# Patient Record
Sex: Male | Born: 1975 | Hispanic: Yes | Marital: Married | Smoking: Current every day smoker
Health system: Southern US, Community
[De-identification: ages and names within clinical notes are randomized; demographics above are authoritative.]

## PROBLEM LIST (undated history)

## (undated) DIAGNOSIS — R569 Unspecified convulsions: Secondary | ICD-10-CM

## (undated) DIAGNOSIS — F1020 Alcohol dependence, uncomplicated: Secondary | ICD-10-CM

## (undated) DIAGNOSIS — I8500 Esophageal varices without bleeding: Secondary | ICD-10-CM

---

## 2013-10-11 ENCOUNTER — Emergency Department (HOSPITAL_COMMUNITY): Payer: Self-pay

## 2013-10-11 ENCOUNTER — Encounter (HOSPITAL_COMMUNITY): Payer: Self-pay | Admitting: Emergency Medicine

## 2013-10-11 ENCOUNTER — Inpatient Hospital Stay (HOSPITAL_COMMUNITY)
Admission: EM | Admit: 2013-10-11 | Discharge: 2013-10-14 | DRG: 441 | Disposition: A | Discharge status: Home / Self Care | Payer: Self-pay | Attending: Internal Medicine | Admitting: Internal Medicine

## 2013-10-11 DIAGNOSIS — K7010 Alcoholic hepatitis without ascites: Secondary | ICD-10-CM | POA: Diagnosis present

## 2013-10-11 DIAGNOSIS — F1010 Alcohol abuse, uncomplicated: Secondary | ICD-10-CM | POA: Diagnosis present

## 2013-10-11 DIAGNOSIS — R578 Other shock: Secondary | ICD-10-CM | POA: Diagnosis present

## 2013-10-11 DIAGNOSIS — I8511 Secondary esophageal varices with bleeding: Secondary | ICD-10-CM | POA: Diagnosis present

## 2013-10-11 DIAGNOSIS — F17200 Nicotine dependence, unspecified, uncomplicated: Secondary | ICD-10-CM | POA: Diagnosis present

## 2013-10-11 DIAGNOSIS — K920 Hematemesis: Secondary | ICD-10-CM

## 2013-10-11 DIAGNOSIS — K7460 Unspecified cirrhosis of liver: Secondary | ICD-10-CM | POA: Diagnosis present

## 2013-10-11 DIAGNOSIS — K766 Portal hypertension: Principal | ICD-10-CM | POA: Diagnosis present

## 2013-10-11 DIAGNOSIS — K922 Gastrointestinal hemorrhage, unspecified: Secondary | ICD-10-CM

## 2013-10-11 DIAGNOSIS — D62 Acute posthemorrhagic anemia: Secondary | ICD-10-CM | POA: Diagnosis present

## 2013-10-11 HISTORY — DX: Unspecified convulsions: R56.9

## 2013-10-11 HISTORY — DX: Alcohol dependence, uncomplicated: F10.20

## 2013-10-11 LAB — CBC WITH DIFFERENTIAL/PLATELET
Basophils Absolute: 0.1 10*3/uL (ref 0.0–0.1)
Basophils Relative: 0 % (ref 0–1)
Eosinophils Relative: 0 % (ref 0–5)
Lymphocytes Relative: 5 % — ABNORMAL LOW (ref 12–46)
Lymphs Abs: 1.3 10*3/uL (ref 0.7–4.0)
MCV: 99.4 fL (ref 78.0–100.0)
Neutro Abs: 25.5 10*3/uL — ABNORMAL HIGH (ref 1.7–7.7)
Neutrophils Relative %: 89 % — ABNORMAL HIGH (ref 43–77)
Platelets: 152 10*3/uL (ref 150–400)
RBC: 3.51 MIL/uL — ABNORMAL LOW (ref 4.22–5.81)
RDW: 15 % (ref 11.5–15.5)
WBC: 28.6 10*3/uL — ABNORMAL HIGH (ref 4.0–10.5)

## 2013-10-11 LAB — URINALYSIS, ROUTINE W REFLEX MICROSCOPIC
Glucose, UA: 100 mg/dL — AB
Hgb urine dipstick: NEGATIVE
Protein, ur: 30 mg/dL — AB

## 2013-10-11 LAB — COMPREHENSIVE METABOLIC PANEL
ALT: 22 U/L (ref 0–53)
AST: 86 U/L — ABNORMAL HIGH (ref 0–37)
CO2: 21 mEq/L (ref 19–32)
Chloride: 101 mEq/L (ref 96–112)
GFR calc non Af Amer: >90 mL/min (ref >90)
Potassium: 4.3 mEq/L (ref 3.5–5.1)
Sodium: 137 mEq/L (ref 135–145)
Total Bilirubin: 1.8 mg/dL — ABNORMAL HIGH (ref 0.3–1.2)
Total Protein: 7.8 g/dL (ref 6.0–8.3)

## 2013-10-11 MED ORDER — PROMETHAZINE HCL 25 MG/ML IJ SOLN
12.5000 mg | Freq: Once | INTRAMUSCULAR | Status: AC
  Administered 2013-10-12: 12.5 mg via INTRAVENOUS
  Filled 2013-10-11: qty 1

## 2013-10-11 MED ORDER — SODIUM CHLORIDE 0.9 % IV SOLN
8.0000 mg/h | INTRAVENOUS | Status: DC
  Administered 2013-10-12 – 2013-10-14 (×6): 8 mg/h via INTRAVENOUS
  Filled 2013-10-11 (×13): qty 80

## 2013-10-11 MED ORDER — PANTOPRAZOLE SODIUM 40 MG IV SOLR
40.0000 mg | Freq: Once | INTRAVENOUS | Status: AC
  Administered 2013-10-12: 40 mg via INTRAVENOUS
  Filled 2013-10-11: qty 40

## 2013-10-11 MED ORDER — SODIUM CHLORIDE 0.9 % IV BOLUS (SEPSIS)
1000.0000 mL | Freq: Once | INTRAVENOUS | Status: AC
  Administered 2013-10-12: 1000 mL via INTRAVENOUS

## 2013-10-11 MED ORDER — SODIUM CHLORIDE 0.9 % IV BOLUS (SEPSIS)
1000.0000 mL | Freq: Once | INTRAVENOUS | Status: AC
  Administered 2013-10-11: 1000 mL via INTRAVENOUS

## 2013-10-11 MED ORDER — ONDANSETRON HCL 4 MG/2ML IJ SOLN
4.0000 mg | Freq: Once | INTRAMUSCULAR | Status: AC
  Administered 2013-10-11: 4 mg via INTRAVENOUS
  Filled 2013-10-11: qty 2

## 2013-10-11 MED ORDER — LORAZEPAM 2 MG/ML IJ SOLN: 1.0000 mg | Freq: Once | INTRAMUSCULAR | Status: DC

## 2013-10-11 MED ORDER — PANTOPRAZOLE SODIUM 40 MG IV SOLR
40.0000 mg | Freq: Once | INTRAVENOUS | Status: AC
  Administered 2013-10-11: 40 mg via INTRAVENOUS
  Filled 2013-10-11: qty 40

## 2013-10-11 NOTE — ED Notes (Signed)
Fiance' states that he has been feeling really full, thirsty, and has been vomiting blood. Started today around 1130-1200. Patient states that he has vomited around 5 times.

## 2013-10-11 NOTE — ED Notes (Signed)
Pt states first time he vomited dark coffee blood, vomiting x5 today, no bright red blood.

## 2013-10-11 NOTE — ED Provider Notes (Signed)
CSN: [id number]     Arrival date & time 10/11/13  2102 History   First MD Initiated Contact with Patient 10/11/13 2114     Chief Complaint  Patient presents with  . Hematemesis  . Weakness   (Consider location/radiation/quality/duration/timing/severity/associated sxs/prior Treatment) HPI Comments: Adam Sanchez is a 37 y.o. Male presenting with hematemesis starting around noon today.  He reports 4-5 episodes of blood emesis, the first episode describing a small quantity of blood,  With each successive episode being frankly bloody.  He has had increasing early satiety and decreased appetite over the past several weeks and has had increasing abdominal distention and bloating. He reports having a normal bowel movement today which was nonbloody and normal color.  He has mild epigastric discomfort.  He does have a history of alcoholism, but decided to cut back 2 weeks ago, his last etoh intake was yesterday.  He has found no alleviators for his symptoms. He denies history prior abdominal complaints, acid reflux and has no surgical history.     The history is provided by the patient.    Past Medical History  Diagnosis Date  . Seizures   . Alcoholism    History reviewed. No pertinent past surgical history. History reviewed. No pertinent family history. History  Substance Use Topics  . Smoking status: Current Every Day Smoker  . Smokeless tobacco: Not on file  . Alcohol Use: Yes    Review of Systems  Constitutional: Negative for fever and diaphoresis.  HENT: Negative for congestion and sore throat.   Eyes: Negative.   Respiratory: Negative for chest tightness and shortness of breath.   Cardiovascular: Negative for chest pain.  Gastrointestinal: Positive for nausea, vomiting and abdominal distention. Negative for abdominal pain, blood in stool and rectal pain.  Genitourinary: Negative.   Musculoskeletal: Negative for arthralgias, joint swelling and neck pain.  Skin: Negative.   Negative for rash and wound.  Neurological: Positive for weakness. Negative for dizziness, light-headedness, numbness and headaches.  Psychiatric/Behavioral: Negative.     Allergies  Review of patient's allergies indicates no known allergies.  Home Medications   Current Outpatient Rx  Name  Route  Sig  Dispense  Refill  . Aspirin-Acetaminophen-Caffeine (GOODY HEADACHE PO)   Oral   Take 1 packet by mouth daily as needed (for pain).         . magnesium citrate SOLN   Oral   Take 0.5-1 Bottles by mouth once.          BP 94/46  Pulse 130  Temp(Src) 99 F (37.2 C) (Oral)  Resp 24  Ht 5\' 7"  (1.702 m)  Wt 180 lb (81.647 kg)  BMI 28.19 kg/m2  SpO2 97% Physical Exam  Nursing note and vitals reviewed. Constitutional: He appears well-developed and well-nourished.  HENT:  Head: Normocephalic and atraumatic.  Eyes: Conjunctivae are normal. No scleral icterus.  Neck: Normal range of motion.  Cardiovascular: Regular rhythm, normal heart sounds and intact distal pulses.  Tachycardia present.   Pulmonary/Chest: Effort normal and breath sounds normal. He has no wheezes.  Abdominal: Soft. Bowel sounds are normal. He exhibits distension. He exhibits no fluid wave and no ascites. There is no hepatosplenomegaly. There is tenderness in the epigastric area. There is no rebound and no guarding.  Mild epigastric discomfort  Musculoskeletal: Normal range of motion.  Neurological: He is alert.  Skin: Skin is warm and dry.  Psychiatric: He has a normal mood and affect.    ED Course  Procedures (including critical  care time) Labs Review Labs Reviewed  CBC WITH DIFFERENTIAL - Abnormal; Notable for the following:    WBC 28.6 (*)    RBC 3.51 (*)    Hemoglobin 11.9 (*)    HCT 34.9 (*)    Neutrophils Relative % 89 (*)    Neutro Abs 25.5 (*)    Lymphocytes Relative 5 (*)    Monocytes Absolute 1.7 (*)    All other components within normal limits  COMPREHENSIVE METABOLIC PANEL - Abnormal;  Notable for the following:    Glucose, Bld 179 (*)    Creatinine, Ser 0.48 (*)    Albumin 2.5 (*)    AST 86 (*)    Alkaline Phosphatase 305 (*)    Total Bilirubin 1.8 (*)    All other components within normal limits  URINALYSIS, ROUTINE W REFLEX MICROSCOPIC - Abnormal; Notable for the following:    Color, Urine AMBER (*)    Glucose, UA 100 (*)    Bilirubin Urine SMALL (*)    Ketones, ur TRACE (*)    Protein, ur 30 (*)    All other components within normal limits  LIPASE, BLOOD  URINE MICROSCOPIC-ADD ON  TYPE AND SCREEN   Imaging Review Dg Abd Acute W/chest  10/11/2013   CLINICAL DATA:  Hematemesis.  EXAM: ACUTE ABDOMEN SERIES (ABDOMEN 2 VIEW & CHEST 1 VIEW)  COMPARISON:  None.  FINDINGS: Normal cardiac silhouette. Lungs are clear. No free air beneath the hemidiaphragms.  Is a paucity of gas within the abdomen. Small a gas in the stomach. Rounded density along the left abdomen measuring 3.8 cm likely represents stool. Normal renal shadows. No pathologic calcifications.  IMPRESSION: 1. No acute cardiopulmonary process.  2. No evidence of bowel obstruction or  intraperitoneal free air.  3. Paucity of gas in the abdomen can be seen with persistent vomiting.   Electronically Signed   By: Genevive Bi M.D.   On: 10/11/2013 22:37    EKG Interpretation   None       MDM   1. Upper GI bleed   2. Alcohol abuse    Pt was also seen by Dr Patria Mane, who spoke with Dr Karilyn Cota who will plan to scope patient in am.  Asked for medical admission. Spoke with Dr. Alvester Morin who will see pt in ed.  Additional IV fluids ordered.  Melena on rectal exam by Dr. Patria Mane.  Hemoglobin stable.     Burgess Amor, PA-C 10/12/13 0020

## 2013-10-12 ENCOUNTER — Encounter (HOSPITAL_COMMUNITY): Payer: Self-pay

## 2013-10-12 ENCOUNTER — Encounter (HOSPITAL_COMMUNITY)
Admission: EM | Disposition: A | Discharge status: Home / Self Care | Payer: Self-pay | Source: Home / Self Care | Attending: Pulmonary Disease

## 2013-10-12 DIAGNOSIS — R578 Other shock: Secondary | ICD-10-CM | POA: Diagnosis present

## 2013-10-12 DIAGNOSIS — K920 Hematemesis: Secondary | ICD-10-CM | POA: Diagnosis present

## 2013-10-12 DIAGNOSIS — F1010 Alcohol abuse, uncomplicated: Secondary | ICD-10-CM

## 2013-10-12 DIAGNOSIS — D62 Acute posthemorrhagic anemia: Secondary | ICD-10-CM

## 2013-10-12 DIAGNOSIS — K922 Gastrointestinal hemorrhage, unspecified: Secondary | ICD-10-CM

## 2013-10-12 HISTORY — PX: ESOPHAGOGASTRODUODENOSCOPY: SHX5428

## 2013-10-12 LAB — RETICULOCYTES
RBC.: 2.51 MIL/uL — ABNORMAL LOW (ref 4.22–5.81)
Retic Count, Absolute: 97.9 10*3/uL (ref 19.0–186.0)
Retic Ct Pct: 3.9 % — ABNORMAL HIGH (ref 0.4–3.1)

## 2013-10-12 LAB — IRON AND TIBC
Iron: 185 ug/dL — ABNORMAL HIGH (ref 42–135)
Saturation Ratios: 74 % — ABNORMAL HIGH (ref 20–55)
UIBC: 64 ug/dL — ABNORMAL LOW (ref 125–400)

## 2013-10-12 LAB — CBC
HCT: 27.1 % — ABNORMAL LOW (ref 39.0–52.0)
Hemoglobin: 8.5 g/dL — ABNORMAL LOW (ref 13.0–17.0)
Hemoglobin: 9.5 g/dL — ABNORMAL LOW (ref 13.0–17.0)
Hemoglobin: 9.4 g/dL — ABNORMAL LOW (ref 13.0–17.0)
MCH: 34 pg (ref 26.0–34.0)
MCHC: 34.1 g/dL (ref 30.0–36.0)
MCHC: 34.7 g/dL (ref 30.0–36.0)
MCV: 93.9 fL (ref 78.0–100.0)
MCV: 93.1 fL (ref 78.0–100.0)
Platelets: 109 10*3/uL — ABNORMAL LOW (ref 150–400)
RBC: 2.94 MIL/uL — ABNORMAL LOW (ref 4.22–5.81)
RBC: 2.91 MIL/uL — ABNORMAL LOW (ref 4.22–5.81)
RDW: 14.9 % (ref 11.5–15.5)
RDW: 17.3 % — ABNORMAL HIGH (ref 11.5–15.5)
WBC: 20.7 10*3/uL — ABNORMAL HIGH (ref 4.0–10.5)
WBC: 17.3 10*3/uL — ABNORMAL HIGH (ref 4.0–10.5)

## 2013-10-12 LAB — HEMOGLOBIN A1C
Hgb A1c MFr Bld: 5.7 % — ABNORMAL HIGH (ref <5.7)
Mean Plasma Glucose: 117 mg/dL — ABNORMAL HIGH (ref <117)

## 2013-10-12 LAB — FERRITIN: Ferritin: 99 ng/mL (ref 22–322)

## 2013-10-12 LAB — FOLATE: Folate: 2.9 ng/mL — ABNORMAL LOW

## 2013-10-12 LAB — GLUCOSE, CAPILLARY: Glucose-Capillary: 93 mg/dL (ref 70–99)

## 2013-10-12 LAB — PROTIME-INR
INR: 1.4 (ref 0.00–1.49)
Prothrombin Time: 16.8 seconds — ABNORMAL HIGH (ref 11.6–15.2)

## 2013-10-12 LAB — MRSA PCR SCREENING: MRSA by PCR: NEGATIVE

## 2013-10-12 LAB — PREPARE RBC (CROSSMATCH)

## 2013-10-12 LAB — AMMONIA: Ammonia: 95 umol/L — ABNORMAL HIGH (ref 11–60)

## 2013-10-12 LAB — ABO/RH: ABO/RH(D): A POS

## 2013-10-12 LAB — OCCULT BLOOD X 1 CARD TO LAB, STOOL: Fecal Occult Bld: POSITIVE — AB

## 2013-10-12 SURGERY — EGD (ESOPHAGOGASTRODUODENOSCOPY)
Anesthesia: Moderate Sedation

## 2013-10-12 MED ORDER — LORAZEPAM 1 MG PO TABS: 1.0000 mg | ORAL_TABLET | Freq: Four times a day (QID) | ORAL | Status: DC | PRN

## 2013-10-12 MED ORDER — FOLIC ACID 1 MG PO TABS
1.0000 mg | ORAL_TABLET | Freq: Every day | ORAL | Status: DC
  Filled 2013-10-12: qty 1

## 2013-10-12 MED ORDER — MIDAZOLAM HCL 5 MG/ML IJ SOLN
INTRAMUSCULAR | Status: AC
  Filled 2013-10-12: qty 2

## 2013-10-12 MED ORDER — MIDAZOLAM HCL 10 MG/2ML IJ SOLN
INTRAMUSCULAR | Status: DC | PRN
  Administered 2013-10-12: 1 mg via INTRAVENOUS
  Administered 2013-10-12 (×2): 2 mg via INTRAVENOUS

## 2013-10-12 MED ORDER — THIAMINE HCL 100 MG/ML IJ SOLN
Freq: Once | INTRAVENOUS | Status: AC
  Administered 2013-10-12: 02:00:00 via INTRAVENOUS
  Filled 2013-10-12: qty 1000

## 2013-10-12 MED ORDER — THIAMINE HCL 100 MG/ML IJ SOLN
INTRAMUSCULAR | Status: AC
  Filled 2013-10-12: qty 2

## 2013-10-12 MED ORDER — PNEUMOCOCCAL VAC POLYVALENT 25 MCG/0.5ML IJ INJ
0.5000 mL | INJECTION | INTRAMUSCULAR | Status: AC
  Administered 2013-10-13: 0.5 mL via INTRAMUSCULAR
  Filled 2013-10-12 (×2): qty 0.5

## 2013-10-12 MED ORDER — LORAZEPAM 2 MG/ML IJ SOLN
0.0000 mg | Freq: Four times a day (QID) | INTRAMUSCULAR | Status: AC
Start: 2013-10-12 — End: 2013-10-14
  Administered 2013-10-13: 1 mg via INTRAVENOUS
  Filled 2013-10-12: qty 1

## 2013-10-12 MED ORDER — INFLUENZA VAC SPLIT QUAD 0.5 ML IM SUSP
0.5000 mL | INTRAMUSCULAR | Status: AC
  Administered 2013-10-13: 0.5 mL via INTRAMUSCULAR
  Filled 2013-10-12 (×3): qty 0.5

## 2013-10-12 MED ORDER — BUTAMBEN-TETRACAINE-BENZOCAINE 2-2-14 % EX AERO
INHALATION_SPRAY | CUTANEOUS | Status: DC | PRN
  Administered 2013-10-12: 2 via TOPICAL

## 2013-10-12 MED ORDER — PANTOPRAZOLE SODIUM 40 MG IV SOLR
INTRAVENOUS | Status: AC
  Filled 2013-10-12: qty 80

## 2013-10-12 MED ORDER — OCTREOTIDE ACETATE 100 MCG/ML IJ SOLN
INTRAMUSCULAR | Status: AC
  Filled 2013-10-12: qty 1

## 2013-10-12 MED ORDER — OCTREOTIDE ACETATE 50 MCG/ML IJ SOLN
25.0000 ug | Freq: Once | INTRAMUSCULAR | Status: AC
  Administered 2013-10-12: 25 ug via INTRAVENOUS
  Filled 2013-10-12: qty 0.5

## 2013-10-12 MED ORDER — LORAZEPAM 2 MG/ML IJ SOLN
1.0000 mg | Freq: Once | INTRAMUSCULAR | Status: AC
  Administered 2013-10-12: 1 mg via INTRAVENOUS
  Filled 2013-10-12: qty 1

## 2013-10-12 MED ORDER — ADULT MULTIVITAMIN W/MINERALS CH
1.0000 | ORAL_TABLET | Freq: Every day | ORAL | Status: DC
  Administered 2013-10-13 – 2013-10-14 (×2): 1 via ORAL
  Filled 2013-10-12 (×3): qty 1

## 2013-10-12 MED ORDER — LORAZEPAM 2 MG/ML IJ SOLN: 0.0000 mg | Freq: Two times a day (BID) | INTRAMUSCULAR | Status: DC

## 2013-10-12 MED ORDER — THIAMINE HCL 100 MG/ML IJ SOLN
100.0000 mg | Freq: Every day | INTRAMUSCULAR | Status: DC
  Filled 2013-10-12 (×3): qty 1

## 2013-10-12 MED ORDER — VITAMIN B-1 100 MG PO TABS
100.0000 mg | ORAL_TABLET | Freq: Every day | ORAL | Status: DC
  Administered 2013-10-13 – 2013-10-14 (×2): 100 mg via ORAL
  Filled 2013-10-12 (×3): qty 1

## 2013-10-12 MED ORDER — SODIUM CHLORIDE 0.9 % IV BOLUS (SEPSIS)
1000.0000 mL | Freq: Once | INTRAVENOUS | Status: AC
  Administered 2013-10-12: 1000 mL via INTRAVENOUS

## 2013-10-12 MED ORDER — FOLIC ACID 5 MG/ML IJ SOLN
INTRAMUSCULAR | Status: AC
  Filled 2013-10-12: qty 0.2

## 2013-10-12 MED ORDER — FOLIC ACID 1 MG PO TABS
1.0000 mg | ORAL_TABLET | Freq: Every day | ORAL | Status: DC
  Administered 2013-10-13 – 2013-10-14 (×2): 1 mg via ORAL
  Filled 2013-10-12 (×3): qty 1

## 2013-10-12 MED ORDER — FOLIC ACID 5 MG/ML IJ SOLN
1.0000 mg | Freq: Every day | INTRAMUSCULAR | Status: DC
  Filled 2013-10-12 (×3): qty 0.2

## 2013-10-12 MED ORDER — OCTREOTIDE ACETATE 500 MCG/ML IJ SOLN
25.0000 ug/h | INTRAMUSCULAR | Status: DC
  Administered 2013-10-12 – 2013-10-13 (×3): 25 ug/h via INTRAVENOUS
  Filled 2013-10-12 (×6): qty 1

## 2013-10-12 MED ORDER — M.V.I. ADULT IV INJ
INJECTION | INTRAVENOUS | Status: AC
  Filled 2013-10-12: qty 10

## 2013-10-12 MED ORDER — FENTANYL CITRATE 0.05 MG/ML IJ SOLN
INTRAMUSCULAR | Status: AC
  Filled 2013-10-12: qty 2

## 2013-10-12 MED ORDER — OCTREOTIDE ACETATE 500 MCG/ML IJ SOLN
INTRAMUSCULAR | Status: AC
  Filled 2013-10-12: qty 1

## 2013-10-12 MED ORDER — CIPROFLOXACIN IN D5W 400 MG/200ML IV SOLN
400.0000 mg | Freq: Two times a day (BID) | INTRAVENOUS | Status: DC
  Administered 2013-10-12 – 2013-10-14 (×4): 400 mg via INTRAVENOUS
  Filled 2013-10-12 (×5): qty 200

## 2013-10-12 MED ORDER — FENTANYL CITRATE 0.05 MG/ML IJ SOLN
INTRAMUSCULAR | Status: DC | PRN
  Administered 2013-10-12 (×3): 25 ug via INTRAVENOUS

## 2013-10-12 MED ORDER — SODIUM CHLORIDE 0.9 % IV SOLN
INTRAVENOUS | Status: DC
  Administered 2013-10-12: 11:00:00 via INTRAVENOUS

## 2013-10-12 MED ORDER — SODIUM CHLORIDE 0.9 % IV SOLN: INTRAVENOUS | Status: DC

## 2013-10-12 MED ORDER — DIPHENHYDRAMINE HCL 50 MG/ML IJ SOLN
INTRAMUSCULAR | Status: AC
  Filled 2013-10-12: qty 1

## 2013-10-12 MED ORDER — LORAZEPAM 2 MG/ML IJ SOLN
1.0000 mg | Freq: Four times a day (QID) | INTRAMUSCULAR | Status: DC | PRN
  Administered 2013-10-12: 1 mg via INTRAVENOUS
  Filled 2013-10-12: qty 1

## 2013-10-12 MED ORDER — DIPHENHYDRAMINE HCL 50 MG/ML IJ SOLN
INTRAMUSCULAR | Status: DC | PRN
  Administered 2013-10-12: 25 mg via INTRAVENOUS

## 2013-10-12 NOTE — ED Notes (Signed)
Patient placed on continuous cardiac monitoring, continuous pulse 0x monitoring 

## 2013-10-12 NOTE — Consult Note (Signed)
Referring Provider: Dr. James Smith Primary Care Physician:  No primary provider on file. Primary Gastroenterologist:  Unassigned  Reason for Consultation:  Hematemesis; GI bleed  HPI: Adam Sanchez is a 37 y.o. male admitted at [facility] for profuse hematemesis and BRBPR in the setting of alcohol abuse and NSAID use. Denies abdominal pain or melena. Reports drinking a 6-pack per day for years but has tried to cut down this year. Also taking Goody's Powders on occasion per his report (admit history reports daily NSAID use, which he currently denies). No family in room during my evaluation. He has had 2 episodes of red blood per rectum without any hematemesis thus far at [facility] and none reported at [facility] per admit note. Hgb 11.9 initially at [facility] and Hgb 8.5 at 0100. S/P 3 U PRBCs. He speaks limited English.     Past Medical History  Diagnosis Date  . Seizures   . Alcoholism     History reviewed. No pertinent past surgical history.  Prior to Admission medications   Medication Sig Start Date End Date Taking? Authorizing Provider  Aspirin-Acetaminophen-Caffeine (GOODY HEADACHE PO) Take 1 packet by mouth daily as needed (for pain).   Yes Historical Provider, MD  magnesium citrate SOLN Take 0.5-1 Bottles by mouth once.   Yes Historical Provider, MD    Scheduled Meds: . [START ON 10/13/2013] influenza vac split quadrivalent PF  0.5 mL Intramuscular Tomorrow-1000  . [START ON 10/13/2013] pneumococcal 23 valent vaccine  0.5 mL Intramuscular Tomorrow-1000   Continuous Infusions: . sodium chloride    . sodium chloride    . octreotide (SANDOSTATIN) infusion 25 mcg/hr (10/12/13 0600)  . pantoprozole (PROTONIX) infusion 8 mg/hr (10/12/13 0600)   PRN Meds:.  Allergies as of 10/11/2013  . (No Known Allergies)    History reviewed. No pertinent family history.  History   Social History  . Marital Status: Divorced    Spouse Name: N/A    Number of Children: N/A  . Years of  Education: N/A   Occupational History  . Not on file.   Social History Main Topics  . Smoking status: Current Every Day Smoker  . Smokeless tobacco: Not on file  . Alcohol Use: Yes  . Drug Use: No  . Sexual Activity: Not on file   Other Topics Concern  . Not on file   Social History Narrative  . No narrative on file    Review of Systems: All negative except as stated above in HPI.  Physical Exam: Vital signs: Filed Vitals:   10/12/13 0800  BP: 129/63  Pulse: 100  Temp: 99 F (37.2 C)  Resp: 22   Last BM Date: 10/12/13 General:   Alert,  Well-developed, well-nourished, pleasant and cooperative in NAD HEENT: scleral injection Lungs:  Clear throughout to auscultation.   No wheezes, crackles, or rhonchi. No acute distress. Heart:  Regular rate and rhythm; no murmurs, clicks, rubs,  or gallops. Abdomen: soft, nontender, nondistended, +BS  Rectal:  Deferred Skin: spider angiomas on chest Ext: no edema Psych: normal affect and mood GI:  Lab Results:  Recent Labs  10/11/13 2155 10/12/13 0103  WBC 28.6* 28.2*  HGB 11.9* 8.5*  HCT 34.9* 24.9*  PLT 152 116*   BMET  Recent Labs  10/11/13 2155  NA 137  K 4.3  CL 101  CO2 21  GLUCOSE 179*  BUN 17  CREATININE 0.48*  CALCIUM 8.5   LFT  Recent Labs  10/11/13 2155  PROT 7.8    ALBUMIN 2.5*  AST 86*  ALT 22  ALKPHOS 305*  BILITOT 1.8*   PT/INR  Recent Labs  10/11/13 2155  LABPROT 16.8*  INR 1.40     Studies/Results: Dg Abd Acute W/chest  10/11/2013   CLINICAL DATA:  Hematemesis.  EXAM: ACUTE ABDOMEN SERIES (ABDOMEN 2 VIEW & CHEST 1 VIEW)  COMPARISON:  None.  FINDINGS: Normal cardiac silhouette. Lungs are clear. No free air beneath the hemidiaphragms.  Is a paucity of gas within the abdomen. Small a gas in the stomach. Rounded density along the left abdomen measuring 3.8 cm likely represents stool. Normal renal shadows. No pathologic calcifications.  IMPRESSION: 1. No acute cardiopulmonary  process.  2. No evidence of bowel obstruction or  intraperitoneal free air.  3. Paucity of gas in the abdomen can be seen with persistent vomiting.   Electronically Signed   By: Stewart  Edmunds M.D.   On: 10/11/2013 22:37    Impression/Plan: 37 yo alcoholic with upper GI bleed concerning for peptic ulcer vs varices. Continue Protonix drip, Octreotide infusion, NPO, blood transfusions, IVFs. Bedside EGD now.    LOS: 1 day   Edwin Cherian C.  10/12/2013, 9:20 AM   

## 2013-10-12 NOTE — Evaluation (Addendum)
Nursing  reported gross melanotic stools x2 as well as progressive tachycardia and relative hypotension. Hemoglobin has trended from 11.9-8.5.  Is currently receiving one unit of packed red cells as well as a normal saline bolus. Has 2 additional units pRBCspending. Patient is alert and conversive at bedside. Discussed case with Dr. Henry Russel with critical care. Agrees for transfer to [facility] under the critical care service for further management given possible risk of hypotensive/hypovolemic shock.

## 2013-10-12 NOTE — H&P (View-Only) (Signed)
Referring Provider: Dr. Henry Russel Primary Care Physician:  No primary provider on file. Primary Gastroenterologist:  Gentry Fitz  Reason for Consultation:  Hematemesis; GI bleed  HPI: Adam Sanchez is a 37 y.o. male admitted at [facility] for profuse hematemesis and BRBPR in the setting of alcohol abuse and NSAID use. Denies abdominal pain or melena. Reports drinking a 6-pack per day for years but has tried to cut down this year. Also taking Goody's Powders on occasion per his report (admit history reports daily NSAID use, which he currently denies). No family in room during my evaluation. He has had 2 episodes of red blood per rectum without any hematemesis thus far at [facility] and none reported at [facility] per admit note. Hgb 11.9 initially at [facility] and Hgb 8.5 at 0100. S/P 3 U PRBCs. He speaks limited [place].     Past Medical History  Diagnosis Date  . Seizures   . Alcoholism     History reviewed. No pertinent past surgical history.  Prior to Admission medications   Medication Sig Start Date End Date Taking? Authorizing Provider  Aspirin-Acetaminophen-Caffeine (GOODY HEADACHE PO) Take 1 packet by mouth daily as needed (for pain).   Yes Historical Provider, MD  magnesium citrate SOLN Take 0.5-1 Bottles by mouth once.   Yes Historical Provider, MD    Scheduled Meds: . [START ON 10/13/2013] influenza vac split quadrivalent PF  0.5 mL Intramuscular Tomorrow-1000  . [START ON 10/13/2013] pneumococcal 23 valent vaccine  0.5 mL Intramuscular Tomorrow-1000   Continuous Infusions: . sodium chloride    . sodium chloride    . octreotide (SANDOSTATIN) infusion 25 mcg/hr (10/12/13 0600)  . pantoprozole (PROTONIX) infusion 8 mg/hr (10/12/13 0600)   PRN Meds:.  Allergies as of 10/11/2013  . (No Known Allergies)    History reviewed. No pertinent family history.  History   Social History  . Marital Status: Divorced    Spouse Name: N/A    Number of Children: N/A  . Years of  Education: N/A   Occupational History  . Not on file.   Social History Main Topics  . Smoking status: Current Every Day Smoker  . Smokeless tobacco: Not on file  . Alcohol Use: Yes  . Drug Use: No  . Sexual Activity: Not on file   Other Topics Concern  . Not on file   Social History Narrative  . No narrative on file    Review of Systems: All negative except as stated above in HPI.  Physical Exam: Vital signs: Filed Vitals:   10/12/13 0800  BP: 129/63  Pulse: 100  Temp: 99 F (37.2 C)  Resp: 22   Last BM Date: 10/12/13 General:   Alert,  Well-developed, well-nourished, pleasant and cooperative in NAD HEENT: scleral injection Lungs:  Clear throughout to auscultation.   No wheezes, crackles, or rhonchi. No acute distress. Heart:  Regular rate and rhythm; no murmurs, clicks, rubs,  or gallops. Abdomen: soft, nontender, nondistended, +BS  Rectal:  Deferred Skin: spider angiomas on chest Ext: no edema Psych: normal affect and mood GI:  Lab Results:  Recent Labs  10/11/13 2155 10/12/13 0103  WBC 28.6* 28.2*  HGB 11.9* 8.5*  HCT 34.9* 24.9*  PLT 152 116*   BMET  Recent Labs  10/11/13 2155  NA 137  K 4.3  CL 101  CO2 21  GLUCOSE 179*  BUN 17  CREATININE 0.48*  CALCIUM 8.5   LFT  Recent Labs  10/11/13 2155  PROT 7.8  ALBUMIN 2.5*  AST 86*  ALT 22  ALKPHOS 305*  BILITOT 1.8*   PT/INR  Recent Labs  10/11/13 2155  LABPROT 16.8*  INR 1.40     Studies/Results: Dg Abd Acute W/chest  10/11/2013   CLINICAL DATA:  Hematemesis.  EXAM: ACUTE ABDOMEN SERIES (ABDOMEN 2 VIEW & CHEST 1 VIEW)  COMPARISON:  None.  FINDINGS: Normal cardiac silhouette. Lungs are clear. No free air beneath the hemidiaphragms.  Is a paucity of gas within the abdomen. Small a gas in the stomach. Rounded density along the left abdomen measuring 3.8 cm likely represents stool. Normal renal shadows. No pathologic calcifications.  IMPRESSION: 1. No acute cardiopulmonary  process.  2. No evidence of bowel obstruction or  intraperitoneal free air.  3. Paucity of gas in the abdomen can be seen with persistent vomiting.   Electronically Signed   By: Genevive Bi M.D.   On: 10/11/2013 22:37    Impression/Plan: 37 yo alcoholic with upper GI bleed concerning for peptic ulcer vs varices. Continue Protonix drip, Octreotide infusion, NPO, blood transfusions, IVFs. Bedside EGD now.    LOS: 1 day   Hamilton Marinello C.  10/12/2013, 9:20 AM

## 2013-10-12 NOTE — Brief Op Note (Signed)
No blood seen on insertion during EGD. Medium-sized esophageal varices seen in mid-distal esophagus. Small red wale sign seen in mid-esophagus. Stomach showed mild portal hypertensive gastropathy. Duodenum normal with clear bilious fluid. S/P variceal band ligation of red wale sign in esophagus. Continue Octreotide infusion, Protonix drip, IV Abx. Doubt red wale sign is source of rectal bleeding with lack of blood distally in examined duodenum. May have mucosal bleeding from cirrhosis in colon or diverticular bleed but he reported hematemesis at home so red wale sign could have caused that. See endopro for details.

## 2013-10-12 NOTE — Progress Notes (Signed)
Pt unstable with frank blood in bowel movements. Dr. Alvester Morin assessed patient and agreed with CCM that pt should be transferred emergently to [facility]. Dr. Alvester Morin instructed me to run blood "as fast as possible" due to patient's internal bleeding. At time of transfer via [facility], pt had 2 units of RBC infused with another unit provided for [facility] to infuse during transport.

## 2013-10-12 NOTE — Progress Notes (Signed)
Pt had one bowel movement with frank blood. Heart rate sustaining at 130 and above with BPs trending down, currently at 91/49. MD notified. Orders given to infuse 1L NS bolus.

## 2013-10-12 NOTE — ED Provider Notes (Signed)
Medical screening examination/treatment/procedure(s) were conducted as a shared visit with non-physician practitioner(s) and myself.  I personally evaluated the patient during the encounter  CRITICAL CARE Performed by: Lyanne Co Total critical care time: 35 Critical care time was exclusive of separately billable procedures and treating other patients. Critical care was necessary to treat or prevent imminent or life-threatening deterioration. Critical care was time spent personally by me on the following activities: development of treatment plan with patient and/or surrogate as well as nursing, discussions with consultants, evaluation of patient's response to treatment, examination of patient, obtaining history from patient or surrogate, ordering and performing treatments and interventions, ordering and review of laboratory studies, ordering and review of radiographic studies, pulse oximetry and re-evaluation of patient's condition.  Patient presents with symptoms concerning for significant upper GI bleed with ongoing tachycardia.  Patient started on a Protonix drip.  Patient with melena on rectal exam.  No hypotension emergency department.  Patient will need to be admitted to the intensive care he had any pain overnight with serial CBCs.  I discussed his case with Dr. Karilyn Cota, who agrees with management of this time and states that he will perform endoscopy in the morning.  No prior history of cirrhosis.  Coffee-ground emesis.  No bright red blood per rectum and no active vomiting of blood while in the ER.  My suspicion for variceal bleed is low at this time.  We'll hold octreotide.  Dr. Karilyn Cota agrees   Lyanne Co, MD 10/12/13 [id number]

## 2013-10-12 NOTE — Interval H&P Note (Signed)
History and Physical Interval Note:  10/12/2013 10:15 AM  Adam Sanchez  has presented today for surgery, with the diagnosis of Gi Bleed  The various methods of treatment have been discussed with the patient and family. After consideration of risks, benefits and other options for treatment, the patient has consented to  Procedure(s): ESOPHAGOGASTRODUODENOSCOPY (EGD) (N/A) as a surgical intervention .  The patient's history has been reviewed, patient examined, no change in status, stable for surgery.  I have reviewed the patient's chart and labs.  Questions were answered to the patient's satisfaction.     [name]

## 2013-10-12 NOTE — H&P (Signed)
PULMONARY  / CRITICAL CARE MEDICINE  Name: Adam Sanchez MRN: [id number] DOB: 07-07-1976    ADMISSION DATE:  10/11/2013  REFERRING MD :  [place]/ [facility] PRIMARY SERVICE: PCCM  CHIEF PROBLEM:  UGIB  BRIEF PATIENT DESCRIPTION: 89M with hx of heavy EtOH and NSAIDS use admitted in transfer from [facility] with UGIB  SIGNIFICANT EVENTS / STUDIES:  10/18 Admit to [facility] with hematemesis and BRBPR 10/19 Transfer to [facility] ICU/PCCM service 10/19 GI consult/EGD: esophageal varices, portal gastropathy, no active bleeding. Varcieal banding performed  LINES / TUBES:   CULTURES: MRSA PCR 10/19 >> NEG  ANTIBIOTICS: Cipro 10/19 (SBP prophy) 10/19 >>   HISTORY OF PRESENT ILLNESS:   Presented in evening of 10/18 to [facility] ED with several episodes of hematemesis. Reported frequent use of Goody powder. Admits to approx 6 beers per day. Initially admitted to [facility] ICU. Received NS and RBC resuscitation. Transferred to [facility] for further mgmt on morning of 10/19.   PAST MEDICAL HISTORY :  Past Medical History  Diagnosis Date  . Seizures   . Alcoholism    History reviewed. No pertinent past surgical history. Prior to Admission medications   Medication Sig Start Date End Date Taking? Authorizing Provider  Aspirin-Acetaminophen-Caffeine (GOODY HEADACHE PO) Take 1 packet by mouth daily as needed (for pain).   Yes Historical Provider, MD  magnesium citrate SOLN Take 0.5-1 Bottles by mouth once.   Yes Historical Provider, MD   No Known Allergies  FAMILY HISTORY:  History reviewed. No pertinent family history. SOCIAL HISTORY:  reports that he has been smoking.  He does not have any smokeless tobacco history on file. He reports that he drinks alcohol. He reports that he does not use illicit drugs. Admits to 6 beers per day  REVIEW OF SYSTEMS:   Limited due to language barrier. Except as above, noncontributory  SUBJECTIVE:   VITAL SIGNS: Temp:  [98.7 F (37.1 C)-99.9 F (37.7 C)] 99 F (37.2 C) (10/19  0930) Pulse Rate:  [97-134] 113 (10/19 1050) Resp:  [15-43] 22 (10/19 0645) BP: (85-135)/(37-105) 116/53 mmHg (10/19 1050) SpO2:  [93 %-100 %] 97 % (10/19 1050) Weight:  [81.647 kg (180 lb)-86.5 kg (190 lb 11.2 oz)] 86.5 kg (190 lb 11.2 oz) (10/19 0200) HEMODYNAMICS:   VENTILATOR SETTINGS:   INTAKE / OUTPUT: Intake/Output     10/18 0701 - 10/19 0700 10/19 0701 - 10/20 0700   I.V. (mL/kg) 2187.1 (25.3) 75 (0.9)   Blood 736.1    Total Intake(mL/kg) 2923.2 (33.8) 75 (0.9)   Urine (mL/kg/hr) 400    Total Output 400     Net +2523.2 +75        Urine Occurrence  1 x   Stool Occurrence 3 x 1 x   Emesis Occurrence       PHYSICAL EXAMINATION: General:  NAD, pleasant Neuro:  Cognition appears intact, no focal deficits HEENT:  No sclericterus, NCAT, EOMI, PERRL Cardiovascular:  RRR s M Lungs:  Clear anteriorly Abdomen: mildly obese, soft, NT, +BS Ext: no edema, warm, normal capillary refill  LABS:  CBC Recent Labs     10/11/13  2155  10/12/13  0103  WBC  28.6*  28.2*  HGB  11.9*  8.5*  HCT  34.9*  24.9*  PLT  152  116*   Coag's Recent Labs     10/11/13  2155  INR  1.40   BMET Recent Labs     10/11/13  2155  NA  137  K  4.3  CL  101  CO2  21  BUN  17  CREATININE  0.48*  GLUCOSE  179*   Electrolytes Recent Labs     10/11/13  2155  CALCIUM  8.5   Sepsis Markers No results found for this basename: LACTICACIDVEN, PROCALCITON, O2SATVEN,  in the last 72 hours ABG No results found for this basename: PHART, PCO2ART, PO2ART,  in the last 72 hours Liver Enzymes Recent Labs     10/11/13  2155  AST  86*  ALT  22  ALKPHOS  305*  BILITOT  1.8*  ALBUMIN  2.5*   Cardiac Enzymes No results found for this basename: TROPONINI, PROBNP,  in the last 72 hours Glucose No results found for this basename: GLUCAP,  in the last 72 hours  Imaging Dg Abd Acute W/chest  10/11/2013   CLINICAL DATA:  Hematemesis.  EXAM: ACUTE ABDOMEN SERIES (ABDOMEN 2 VIEW & CHEST 1  VIEW)  COMPARISON:  None.  FINDINGS: Normal cardiac silhouette. Lungs are clear. No free air beneath the hemidiaphragms.  Is a paucity of gas within the abdomen. Small a gas in the stomach. Rounded density along the left abdomen measuring 3.8 cm likely represents stool. Normal renal shadows. No pathologic calcifications.  IMPRESSION: 1. No acute cardiopulmonary process.  2. No evidence of bowel obstruction or  intraperitoneal free air.  3. Paucity of gas in the abdomen can be seen with persistent vomiting.   Electronically Signed   By: Genevive Bi M.D.   On: 10/11/2013 22:37     CXR:   ASSESSMENT / PLAN:  PULMONARY A: No issues P:   Monitor  CARDIOVASCULAR A: Hemorrhagic hypotension, resolved P:  Monitor Maintenance IVFs ordered while NPO  RENAL A:  No issues P:   Monitor BMET intermittently Correct electrolytes as indicated   GASTROINTESTINAL A:  Alcoholic hepatitis UGIB - likely variceal Melena/hematochezia - ? Second source of bleeding P:   GI eval and mgmt appreciated  HEMATOLOGIC A:  Acute blood loss anemia P:  Monitor CBC frequently Transfuse for acute bleeding, Hgb< 8.0 and/or shock  INFECTIOUS A:  Risk for SBP in setting of variceal bleed P:   Micro and abx as above  ENDOCRINE A:  Hyperglycemia without prior diagnosis of DM   Risk of hypoglycemia due to liver disease/NPO status P:   Monitor CBGs q 8 hrs SSI for glu > 200  NEUROLOGIC A:  Heavy EtOH Risk of withdrawal syndromes P:   CIWA  TODAY'S SUMMARY:   I have personally obtained a history, examined the patient, evaluated laboratory and imaging results, formulated the assessment and plan and placed orders. CRITICAL CARE: The patient is critically ill with multiple organ systems failure and requires high complexity decision making for assessment and support, frequent evaluation and titration of therapies, application of advanced monitoring technologies and extensive interpretation of multiple  databases. Critical Care Time devoted to patient care services described in this note is -- minutes.   Billy Fischer, MD ; [facility] [phone].  After 5:30 PM or weekends, call [phone]  Pulmonary and Critical Care Medicine [facility] Pager: [phone]  10/12/2013, 11:49 AM

## 2013-10-12 NOTE — Evaluation (Signed)
Patient with reported hemoglobin of 8.5. Down from 11.9 approximately 4 hours ago. Noted multiple episodes of hematemesis on admission. Has received about 3-4 L of IV fluids since admission. Hemoccults and gastric wall pending. Discussed with ICU RN to type and cross patient. Transfuse one unit post transfusion CBC.

## 2013-10-12 NOTE — H&P (Addendum)
Hospitalist Admission History and Physical  Patient name: Adam Sanchez Medical record number: [id number] Date of birth: 24-Apr-1976 Age: 37 y.o. Gender: male  Primary Care Provider: No primary provider on file.  Chief Complaint: hematemesis History of Present Illness:This is a 37 y.o. year old male with heavy alcohol history presenting with multiple episodes of hematemesis today. Patient is primarily Spanish speaking. English speaking daughter is at bedside. Per her report, patient had 4-5 episodes of vomiting bright red blood over the past day. There is no evidence of bright red blood on patient shoes. Her girlfriend, patient has very high alcohol intake. Patient drinks at least one sixpack of beer per day as well as daily grain alcohol intake. Patient has been taking good powders as well as ibuprofen on a daily basis for GI discomfort. Patient denies any prior history of this in the past. Girlfriend does report the patient has been hospitalized in the chart restricting in the past with questionable seizure activity. Dr. Karilyn Cota was contacted in the ER about case. Is planning for upper endoscopy in the morning. No hematemesis since hospitalization.   AST mildly elevated at 86, a AST mildly elevated at 86. Alkaline phosphatase at 305, T. bili elevated at 1.8.  There are no active problems to display for this patient.  Past Medical History: Past Medical History  Diagnosis Date  . Seizures   . Alcoholism     Past Surgical History: History reviewed. No pertinent past surgical history.  Social History: History   Social History  . Marital Status: Divorced    Spouse Name: N/A    Number of Children: N/A  . Years of Education: N/A   Social History Main Topics  . Smoking status: Current Every Day Smoker  . Smokeless tobacco: None  . Alcohol Use: Yes  . Drug Use: No  . Sexual Activity: None   Other Topics Concern  . None   Social History Narrative  . None    Family History: History  reviewed. No pertinent family history.  Allergies: No Known Allergies  Current Facility-Administered Medications  Medication Dose Route Frequency Provider Last Rate Last Dose  . 0.9 %  sodium chloride infusion   Intravenous Continuous Doree Albee, MD      . pantoprazole (PROTONIX) 80 mg in sodium chloride 0.9 % 250 mL infusion  8 mg/hr Intravenous Continuous Lyanne Co, MD 25 mL/hr at 10/12/13 0023 8 mg/hr at 10/12/13 0023  . sodium chloride 0.9 % 1,000 mL with thiamine 100 mg, folic acid 1 mg, multivitamins adult 10 mL infusion   Intravenous Once Doree Albee, MD      . sodium chloride 0.9 % bolus 1,000 mL  1,000 mL Intravenous Once Burgess Amor, PA-C 1,000 mL/hr at 10/12/13 0011 1,000 mL at 10/12/13 0011   Current Outpatient Prescriptions  Medication Sig Dispense Refill  . Aspirin-Acetaminophen-Caffeine (GOODY HEADACHE PO) Take 1 packet by mouth daily as needed (for pain).      . magnesium citrate SOLN Take 0.5-1 Bottles by mouth once.       Review Of Systems: 12 point ROS negative except as noted above in HPI.  Physical Exam: Filed Vitals:   10/12/13 0030  BP: 93/45  Pulse: 116  Temp:   Resp: 20    General: In bed, resting status post Ativan, responsive to questioning. HEENT: PERRLA, extra ocular movement intact and sclera clear, anicteric Heart: S1, S2 normal, no murmur, rub or gallop, regular rate and rhythm Lungs: clear to auscultation, no wheezes or  rales and unlabored breathing Abdomen: Positive bowel sounds, questionable right upper quadrant tenderness Extremities: extremities normal, atraumatic, no cyanosis or edema Skin:no rashes, no ecchymoses Neurology: normal without focal findings apart from mild asterixis.   Labs and Imaging: Lab Results  Component Value Date/Time   NA 137 10/11/2013  9:55 PM   K 4.3 10/11/2013  9:55 PM   CL 101 10/11/2013  9:55 PM   CO2 21 10/11/2013  9:55 PM   BUN 17 10/11/2013  9:55 PM   CREATININE 0.48* 10/11/2013  9:55 PM    GLUCOSE 179* 10/11/2013  9:55 PM   Hepatic Function Panel     Component Value Date/Time   PROT 7.8 10/11/2013 2155   ALBUMIN 2.5* 10/11/2013 2155   AST 86* 10/11/2013 2155   ALT 22 10/11/2013 2155   ALKPHOS 305* 10/11/2013 2155   BILITOT 1.8* 10/11/2013 2155     Lab Results  Component Value Date   WBC 28.6* 10/11/2013   HGB 11.9* 10/11/2013   HCT 34.9* 10/11/2013   MCV 99.4 10/11/2013   PLT 152 10/11/2013     Dg Abd Acute W/chest  10/11/2013   CLINICAL DATA:  Hematemesis.  EXAM: ACUTE ABDOMEN SERIES (ABDOMEN 2 VIEW & CHEST 1 VIEW)  COMPARISON:  None.  FINDINGS: Normal cardiac silhouette. Lungs are clear. No free air beneath the hemidiaphragms.  Is a paucity of gas within the abdomen. Small a gas in the stomach. Rounded density along the left abdomen measuring 3.8 cm likely represents stool. Normal renal shadows. No pathologic calcifications.  IMPRESSION: 1. No acute cardiopulmonary process.  2. No evidence of bowel obstruction or  intraperitoneal free air.  3. Paucity of gas in the abdomen can be seen with persistent vomiting.   Electronically Signed   By: Genevive Bi M.D.   On: 10/11/2013 22:37     Assessment and Plan: Adam Sanchez is a 37 y.o. year old male presenting with hematemesis  Hematemesis: Likely secondary to upper GI bleed. Excess of alcohol and NSAID use likely predominant etiology. Type and screen. Aggressive IV hydration. High dose PPI. Serial CBCs. Gastroccult and hemoccult pending. ? Cirrhotic disease given mild RUQ tenderness. May benefit from octreotide. Will discuss with GI. Pending GI consult and likely scope in am.  Ethanol abuse: CIWA protocol. Banana bag IV x1. Check ammonia level given mild asterixis on exam. Anemia: Likely acute blood loss anemia. Serial CBCs. No active bleeding currently. Type and screen. Hemodynamically stable. check anemia panel.  Leukocytosis: Likely reactive secondary to upper GI bleed. We'll trend. Hydrate and  reassess. FEN/GI: N.p.o. I dose PPI Prophylaxis: Anticoagulation contraindicated given active GI bleed. SCDs. Disposition: Pending further evaluate Code Status: Full code       Doree Albee MD  Pager: [phone]

## 2013-10-12 NOTE — Op Note (Signed)
[facility] [address] [place], [address]   ENDOSCOPY PROCEDURE REPORT  PATIENT: Adam Sanchez, Adam Sanchez  MR#: [id number] BIRTHDATE: 28-Apr-1976 , 37  yrs. old GENDER: Male  ENDOSCOPIST: Charlott Rakes, MD REFERRED [id number] team  PROCEDURE DATE:  10/12/2013 PROCEDURE:   EGD w/ band ligation of varices ASA CLASS:   Class IV INDICATIONS:Hematemesis.   Hematochezia. MEDICATIONS: Fentanyl 75 mcg IV, Versed 5 mg IV, Diphenhydramine (Benadryl) 25 mg IV, and Cetacaine spray x 2  TOPICAL ANESTHETIC:  DESCRIPTION OF PROCEDURE:   After the risks benefits and alternatives of the procedure were thoroughly explained, informed consent was obtained.  The Pentax Gastroscope [id number]  endoscope was introduced through the mouth and advanced to the second portion of the duodenum , limited by Without limitations.   The instrument was slowly withdrawn as the mucosa was fully examined.     FINDINGS: The endoscope was inserted into the oropharynx and esophagus was intubated.  Medium-sized esophageal varices were seen. The gastroesophageal junction was noted to be 40 cm from the incisors.  Endoscope was advanced into the stomach, which revealed a mosaic pattern consistent with portal hypertensive gastropathy that is mild. No blood products seen in the stomach.  The endoscope was advanced to the duodenal bulb and second portion of duodenum which were unremarkable and clear bilious fluid is noted. The endoscope was withdrawn back into the stomach and retroflexion revealed a normal proximal stomach. On withdrawal into the esophagus a small red spot was seen at 28 cm and the varices were noted to extend from 28 cm - 40 cm. The endoscope was withdrawn and a band ligator was attached. One band misfired at the site of the red wale and the mucosa was noted to be friable. Another band was placed successfully.  COMPLICATIONS: None  ENDOSCOPIC IMPRESSION:     Red wale sign in  mid-esophagus -s/p EVBL X 1 Medium-sized esophageal varices Mild portal hypertensive gastropathy    RECOMMENDATIONS: Continue Octreotide; Protonix drip; IV Abx; NPO; Supportive care; Follow closely for further bleeding   REPEAT EXAM: N/A  _______________________________ Charlott Rakes, MD eSigned:  Charlott Rakes, MD 10/12/2013 1:13 PM    CC:  PATIENT NAME:  Adam Sanchez, Adam Sanchez MR#: [id number]

## 2013-10-13 ENCOUNTER — Encounter (HOSPITAL_COMMUNITY): Payer: Self-pay | Admitting: Gastroenterology

## 2013-10-13 ENCOUNTER — Inpatient Hospital Stay (HOSPITAL_COMMUNITY): Payer: Self-pay

## 2013-10-13 DIAGNOSIS — K920 Hematemesis: Secondary | ICD-10-CM

## 2013-10-13 LAB — COMPREHENSIVE METABOLIC PANEL
Albumin: 1.9 g/dL — ABNORMAL LOW (ref 3.5–5.2)
Alkaline Phosphatase: 177 U/L — ABNORMAL HIGH (ref 39–117)
BUN: 13 mg/dL (ref 6–23)
Chloride: 106 mEq/L (ref 96–112)
Creatinine, Ser: 0.56 mg/dL (ref 0.50–1.35)
GFR calc Af Amer: >90 mL/min (ref >90)
GFR calc non Af Amer: >90 mL/min (ref >90)
Glucose, Bld: 87 mg/dL (ref 70–99)
Potassium: 4 mEq/L (ref 3.5–5.1)
Total Bilirubin: 1.4 mg/dL — ABNORMAL HIGH (ref 0.3–1.2)

## 2013-10-13 LAB — GLUCOSE, CAPILLARY
Glucose-Capillary: 117 mg/dL — ABNORMAL HIGH (ref 70–99)
Glucose-Capillary: 131 mg/dL — ABNORMAL HIGH (ref 70–99)
Glucose-Capillary: 79 mg/dL (ref 70–99)
Glucose-Capillary: 88 mg/dL (ref 70–99)

## 2013-10-13 LAB — TYPE AND SCREEN
ABO/RH(D): A POS
Unit division: 0
Unit division: 0

## 2013-10-13 LAB — CBC
HCT: 27.6 % — ABNORMAL LOW (ref 39.0–52.0)
HCT: 26.4 % — ABNORMAL LOW (ref 39.0–52.0)
Hemoglobin: 9.5 g/dL — ABNORMAL LOW (ref 13.0–17.0)
Hemoglobin: 9.6 g/dL — ABNORMAL LOW (ref 13.0–17.0)
Hemoglobin: 9.6 g/dL — ABNORMAL LOW (ref 13.0–17.0)
MCH: 34.3 pg — ABNORMAL HIGH (ref 26.0–34.0)
MCH: 32.4 pg (ref 26.0–34.0)
MCHC: 36.4 g/dL — ABNORMAL HIGH (ref 30.0–36.0)
MCHC: 34.3 g/dL (ref 30.0–36.0)
MCV: 94.8 fL (ref 78.0–100.0)
Platelets: 253 10*3/uL (ref 150–400)
Platelets: 119 10*3/uL — ABNORMAL LOW (ref 150–400)
RBC: 2.96 MIL/uL — ABNORMAL LOW (ref 4.22–5.81)
RDW: 17.5 % — ABNORMAL HIGH (ref 11.5–15.5)
RDW: 17.1 % — ABNORMAL HIGH (ref 11.5–15.5)
RDW: 16.2 % — ABNORMAL HIGH (ref 11.5–15.5)
WBC: 14.5 10*3/uL — ABNORMAL HIGH (ref 4.0–10.5)
WBC: 14.3 10*3/uL — ABNORMAL HIGH (ref 4.0–10.5)
WBC: 14.3 10*3/uL — ABNORMAL HIGH (ref 4.0–10.5)

## 2013-10-13 MED ORDER — ACETAMINOPHEN 325 MG PO TABS: 325.0000 mg | ORAL_TABLET | Freq: Four times a day (QID) | ORAL | Status: DC | PRN

## 2013-10-13 NOTE — Progress Notes (Signed)
Subjective: No further hematemesis. No bloody stools in ~ 24 hours. Little bit of chest tenderness after variceal band placement.  Objective: Vital signs in last 24 hours: Temp:  [98.1 F (36.7 C)-99.1 F (37.3 C)] 98.5 F (36.9 C) (10/20 0800) Pulse Rate:  [79-117] 81 (10/20 0700) Resp:  [18] 18 (10/19 2000) BP: (100-135)/(39-105) 106/61 mmHg (10/20 0700) SpO2:  [91 %-100 %] 96 % (10/20 0700) Weight change: 2.268 kg (5 lb) Last BM Date: 10/12/13  PE: GEN:  NAD ABD:  Soft, non-tender.  Lab Results: CBC    Component Value Date/Time   WBC 14.5* 10/13/2013 0527   RBC 2.91* 10/13/2013 0527   RBC 2.51* 10/12/2013 0103   HGB 9.5* 10/13/2013 0527   HCT 27.6* 10/13/2013 0527   PLT 101* 10/13/2013 0527   MCV 94.8 10/13/2013 0527   MCH 32.6 10/13/2013 0527   MCHC 34.4 10/13/2013 0527   RDW 17.5* 10/13/2013 0527   LYMPHSABS 1.3 10/11/2013 2155   MONOABS 1.7* 10/11/2013 2155   EOSABS 0.0 10/11/2013 2155   BASOSABS 0.1 10/11/2013 2155   Assessment:  1.  Esophageal variceal bleeding, post EGD with band placement.  No further bleeding.  Plan:  1.  Continue octreotide and protonix gtts for another 24 hours. 2.  Clear liquids today, advance slowly as tolerated. 3.  Antibiotics (cipro) for 5 days. 4.  Hopefully discharge late tomorrow or early Wednesday. 5.  Will follow.   Brentlee Delage M 10/13/2013, 8:16 AM

## 2013-10-13 NOTE — Evaluation (Signed)
Physical Therapy Evaluation Patient Details Name: Adam Sanchez MRN: [id number] DOB: [birth year] Today's Date: 10/13/2013 Time: 4098-1191 PT Time Calculation (min): 21 min  PT Assessment / Plan / Recommendation History of Present Illness  43M with hx of heavy EtOH and NSAIDS use admitted in transfer from [facility] with UGIB s/p vericeal banding   Clinical Impression  Pt is pleasant and oriented other than to day of week. Pt with higher level balance deficits placing pt at moderate fall risk and will benefit from acute therapy to maximize function and independence as well as OPPT to maximize balance.     PT Assessment  Patient needs continued PT services    Follow Up Recommendations  Outpatient PT    Does the patient have the potential to tolerate intense rehabilitation      Barriers to Discharge Inaccessible home environment      Equipment Recommendations  None recommended by PT    Recommendations for Other Services     Frequency Min 3X/week    Precautions / Restrictions Precautions Precautions: Fall   Pertinent Vitals/Pain No pain VSS     Mobility  Bed Mobility Bed Mobility: Supine to Sit Supine to Sit: 6: Modified independent (Device/Increase time);HOB flat Transfers Transfers: Sit to Stand;Stand to Sit;Stand Pivot Transfers Sit to Stand: 7: Independent;From bed;From chair/3-in-1 Stand to Sit: 7: Independent;To chair/3-in-1 Stand Pivot Transfers: 7: Independent Ambulation/Gait Ambulation/Gait Assistance: 6: Modified independent (Device/Increase time) Ambulation Distance (Feet): 500 Feet Assistive device: None Ambulation/Gait Assistance Details: Pt with steady gait without LOB, no challenges performed during gait Gait Pattern: Within Functional Limits Gait velocity: WFL Stairs: Yes Stairs Assistance: 6: Modified independent (Device/Increase time) Stair Management Technique: One rail Right;Forwards;Alternating pattern Number of Stairs: 10    Exercises     PT  Diagnosis: Abnormality of gait  PT Problem List: Decreased balance;Decreased cognition PT Treatment Interventions: Gait training;Therapeutic activities;Functional mobility training;Balance training;Neuromuscular re-education;Patient/family education     PT Goals(Current goals can be found in the care plan section) Acute Rehab PT Goals 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 PT Goal Formulation: With patient Time For Goal Achievement: 10/20/13 Potential to Achieve Goals: Good  Visit Information  Last PT Received On: 10/13/13 Assistance Needed: +1 History of Present Illness: 43M with hx of heavy EtOH and NSAIDS use admitted in transfer from [facility] with UGIB s/p vericeal banding        Prior Functioning  Home Living Family/patient expects to be discharged to:: Private residence Living Arrangements: Spouse/significant other Available Help at Discharge: Family;Available PRN/intermittently Type of Home: House Home Access: Level entry Home Layout: Laundry or work area in basement;Bed/bath upstairs;Multi-level Alternate Teacher, music of Steps: 12 Home Equipment: None Prior Function Level of Independence: Independent Communication Communication: No difficulties    Cognition  Cognition Arousal/Alertness: Awake/alert Behavior During Therapy: WFL for tasks assessed/performed Overall Cognitive Status: Impaired/Different from baseline Area of Impairment: Orientation Orientation Level: Time;Situation    Extremity/Trunk Assessment Upper Extremity Assessment Upper Extremity Assessment: Overall WFL for tasks assessed Lower Extremity Assessment Lower Extremity Assessment: Overall WFL for tasks assessed Cervical / Trunk Assessment Cervical / Trunk Assessment: Normal   Balance Balance Balance Assessed: Yes Standardized Balance Assessment Standardized Balance Assessment: Berg Balance Test Berg Balance Test Sit to Stand: Able to stand without using hands and stabilize  independently Standing Unsupported: Able to stand safely 2 minutes Sitting with Back Unsupported but Feet Supported on Floor or Stool: Able to sit safely and securely 2 minutes Stand to Sit: Sits safely with minimal use of  hands Transfers: Able to transfer safely, minor use of hands Standing Unsupported with Eyes Closed: Able to stand 10 seconds safely Standing Ubsupported with Feet Together: Able to place feet together independently and stand 1 minute safely From Standing, Reach Forward with Outstretched Arm: Can reach confidently >25 cm (10") From Standing Position, Pick up Object from Floor: Able to pick up shoe safely and easily From Standing Position, Turn to Look Behind Over each Shoulder: Looks behind from both sides and weight shifts well Turn 360 Degrees: Able to turn 360 degrees safely in 4 seconds or less Standing Unsupported, Alternately Place Feet on Step/Stool: Able to complete 4 steps without aid or supervision Standing Unsupported, One Foot in Front: Needs help to step but can hold 15 seconds Standing on One Leg: Tries to lift leg/unable to hold 3 seconds but remains standing independently Total Score: 48  End of Session PT - End of Session Equipment Utilized During Treatment: Gait belt Activity Tolerance: Patient tolerated treatment well Patient left: in chair;with call bell/phone within reach Nurse Communication: Mobility status  GP     Toney Sang Beth 10/13/2013, 1:12 PM  Delaney Meigs, PT [phone]

## 2013-10-13 NOTE — H&P (Signed)
PULMONARY  / CRITICAL CARE MEDICINE  Name: Draiden Mirsky MRN: [id number] DOB: Apr 18, 1976    ADMISSION DATE:  10/11/2013  REFERRING MD :  [place]/ [facility] PRIMARY SERVICE: PCCM  CHIEF PROBLEM:  UGIB  BRIEF PATIENT DESCRIPTION: 21M with hx of heavy EtOH and NSAIDS use admitted in transfer from [facility] with UGIB  SIGNIFICANT EVENTS / STUDIES:  10/18 Admit to [facility] with hematemesis and BRBPR 10/19 Transfer to [facility] ICU/PCCM service 10/19 GI consult/EGD: esophageal varices, portal gastropathy, no active bleeding. Varcieal banding performed 10/20 transfer to SDU  LINES / TUBES: PIV  CULTURES: MRSA PCR 10/19 >> NEG  ANTIBIOTICS: Cipro 10/19 (SBP prophy) 10/19 >>   SUBJECTIVE:  No events overnight, no further transfusion overnight, no complaints this AM.  VITAL SIGNS: Temp:  [98.1 F (36.7 C)-99 F (37.2 C)] 98.5 F (36.9 C) (10/20 0800) Pulse Rate:  [79-117] 84 (10/20 0800) Resp:  [18] 18 (10/19 2000) BP: (100-135)/(39-105) 123/67 mmHg (10/20 0800) SpO2:  [91 %-100 %] 96 % (10/20 0800) HEMODYNAMICS:   VENTILATOR SETTINGS:   INTAKE / OUTPUT: Intake/Output     10/19 0701 - 10/20 0700 10/20 0701 - 10/21 0700   I.V. (mL/kg) 2790.8 (33.3) 137.5 (1.6)   Blood     IV Piggyback 400    Total Intake(mL/kg) 3190.8 (38) 137.5 (1.6)   Urine (mL/kg/hr)     Total Output       Net +3190.8 +137.5        Urine Occurrence 3 x    Stool Occurrence 3 x      PHYSICAL EXAMINATION: General:  NAD, pleasant Neuro:  Cognition appears intact, no focal deficits HEENT:  No sclericterus, NCAT, EOMI, PERRL Cardiovascular:  RRR s M Lungs:  Clear anteriorly Abdomen: mildly obese, soft, NT, +BS Ext: no edema, warm, normal capillary refill  LABS:  CBC Recent Labs     10/12/13  1300  10/12/13  1948  10/13/13  0527  WBC  20.7*  17.3*  14.5*  HGB  9.5*  9.4*  9.5*  HCT  27.6*  27.1*  27.6*  PLT  109*  110*  101*   Coag's Recent Labs     10/11/13  2155  INR  1.40   BMET Recent Labs      10/11/13  2155  10/13/13  0527  NA  137  137  K  4.3  4.0  CL  101  106  CO2  21  23  BUN  17  13  CREATININE  0.48*  0.56  GLUCOSE  179*  87   Electrolytes Recent Labs     10/11/13  2155  10/13/13  0527  CALCIUM  8.5  7.7*   Sepsis Markers No results found for this basename: LACTICACIDVEN, PROCALCITON, O2SATVEN,  in the last 72 hours ABG No results found for this basename: PHART, PCO2ART, PO2ART,  in the last 72 hours Liver Enzymes Recent Labs     10/11/13  2155  10/13/13  0527  AST  86*  128*  ALT  22  22  ALKPHOS  305*  177*  BILITOT  1.8*  1.4*  ALBUMIN  2.5*  1.9*   Cardiac Enzymes No results found for this basename: TROPONINI, PROBNP,  in the last 72 hours Glucose Recent Labs     10/12/13  1941  10/12/13  2347  10/13/13  0348  GLUCAP  93  79  88    Imaging Dg Chest Port 1 View  10/13/2013   CLINICAL DATA:  Shortness of breath and respiratory failure.  EXAM: PORTABLE CHEST - 1 VIEW  COMPARISON:  10/11/2013  FINDINGS: Single view of the chest demonstrates decreased lung volumes. Slightly increased lung markings, particularly in the left lung. Heart size is within normal limits. The trachea is midline.  IMPRESSION: Increased lung markings are probably related to low lung volumes. There is no focal airspace disease.   Electronically Signed   By: Richarda Overlie M.D.   On: 10/13/2013 07:34   Dg Abd Acute W/chest  10/11/2013   CLINICAL DATA:  Hematemesis.  EXAM: ACUTE ABDOMEN SERIES (ABDOMEN 2 VIEW & CHEST 1 VIEW)  COMPARISON:  None.  FINDINGS: Normal cardiac silhouette. Lungs are clear. No free air beneath the hemidiaphragms.  Is a paucity of gas within the abdomen. Small a gas in the stomach. Rounded density along the left abdomen measuring 3.8 cm likely represents stool. Normal renal shadows. No pathologic calcifications.  IMPRESSION: 1. No acute cardiopulmonary process.  2. No evidence of bowel obstruction or  intraperitoneal free air.  3. Paucity of gas in the  abdomen can be seen with persistent vomiting.   Electronically Signed   By: Genevive Bi M.D.   On: 10/11/2013 22:37     CXR:   ASSESSMENT / PLAN:  PULMONARY A: No issues P:   - Monitor.  CARDIOVASCULAR A: Hemorrhagic hypotension, resolved P:  - Monitor. - KVO IVF.  RENAL A:  No issues P:   - KVO IVF. - BMET in AM. - Replace electrolytes as needed.  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 P:   - Advance diet to regular as recommended by GI but slowly (banding). - Octreotide and protonix drip for another 24 hours.   HEMATOLOGIC A:  Acute blood loss anemia P:  - Monitor CBC frequently. - Transfuse for acute bleeding, Hgb< 8.0 and/or shock.  INFECTIOUS A:  Risk for SBP in setting of variceal bleed P:   - Micro and abx as above.  ENDOCRINE A:  Hyperglycemia without prior diagnosis of DM   Risk of hypoglycemia due to liver disease/NPO status P:   - Monitor CBGs q 8 hrs. - SSI for glu > 200.  NEUROLOGIC A:  Heavy EtOH Risk of withdrawal syndromes P:   - CIWA.  TODAY'S SUMMARY: No further active bleeding, transfer to SDU and to [facility], PCCM will be available PRN.  I have personally obtained a history, examined the patient, evaluated laboratory and imaging results, formulated the assessment and plan and placed orders.  Alyson Reedy, M.D. [facility] Pulmonary/Critical Care Medicine. Pager: [phone]. After hours pager: [phone].

## 2013-10-14 LAB — CBC
Hemoglobin: 9.7 g/dL — ABNORMAL LOW (ref 13.0–17.0)
Hemoglobin: 10.6 g/dL — ABNORMAL LOW (ref 13.0–17.0)
MCH: 32.6 pg (ref 26.0–34.0)
MCH: 32.5 pg (ref 26.0–34.0)
MCHC: 34 g/dL (ref 30.0–36.0)
Platelets: 123 10*3/uL — ABNORMAL LOW (ref 150–400)
RBC: 2.98 MIL/uL — ABNORMAL LOW (ref 4.22–5.81)
RDW: 16.3 % — ABNORMAL HIGH (ref 11.5–15.5)
RDW: 16 % — ABNORMAL HIGH (ref 11.5–15.5)
WBC: 13.1 10*3/uL — ABNORMAL HIGH (ref 4.0–10.5)

## 2013-10-14 LAB — GLUCOSE, CAPILLARY
Glucose-Capillary: 101 mg/dL — ABNORMAL HIGH (ref 70–99)
Glucose-Capillary: 120 mg/dL — ABNORMAL HIGH (ref 70–99)

## 2013-10-14 LAB — BASIC METABOLIC PANEL
BUN: 7 mg/dL (ref 6–23)
Calcium: 8.1 mg/dL — ABNORMAL LOW (ref 8.4–10.5)
GFR calc Af Amer: >90 mL/min (ref >90)
GFR calc non Af Amer: >90 mL/min (ref >90)
Glucose, Bld: 96 mg/dL (ref 70–99)
Potassium: 4 mEq/L (ref 3.5–5.1)
Sodium: 137 mEq/L (ref 135–145)

## 2013-10-14 LAB — PHOSPHORUS: Phosphorus: 4.4 mg/dL (ref 2.3–4.6)

## 2013-10-14 LAB — MAGNESIUM: Magnesium: 1.7 mg/dL (ref 1.5–2.5)

## 2013-10-14 MED ORDER — PANTOPRAZOLE SODIUM 40 MG PO TBEC: 40.0000 mg | DELAYED_RELEASE_TABLET | Freq: Every day | ORAL | Status: DC

## 2013-10-14 MED ORDER — PANTOPRAZOLE SODIUM 40 MG PO TBEC
40.0000 mg | DELAYED_RELEASE_TABLET | Freq: Every day | ORAL | Status: DC
  Administered 2013-10-14: 40 mg via ORAL
  Filled 2013-10-14: qty 1

## 2013-10-14 MED ORDER — CIPROFLOXACIN HCL 500 MG PO TABS: 500.0000 mg | ORAL_TABLET | Freq: Two times a day (BID) | ORAL | Status: DC

## 2013-10-14 MED ORDER — ADULT MULTIVITAMIN W/MINERALS CH: 1.0000 | ORAL_TABLET | Freq: Every day | ORAL | Status: DC

## 2013-10-14 MED ORDER — CIPROFLOXACIN HCL 500 MG PO TABS
500.0000 mg | ORAL_TABLET | Freq: Two times a day (BID) | ORAL | Status: DC
  Administered 2013-10-14: 500 mg via ORAL
  Filled 2013-10-14 (×3): qty 1

## 2013-10-14 NOTE — Progress Notes (Signed)
Subjective: No abdominal pain. No further bleeding. Tolerating diet.  Objective: Vital signs in last 24 hours: Temp:  [97.4 F (36.3 C)-98.7 F (37.1 C)] 98.1 F (36.7 C) (10/21 0750) Pulse Rate:  [80-92] 88 (10/21 0750) BP: (117-141)/(53-79) 123/73 mmHg (10/21 0750) SpO2:  [95 %-100 %] 99 % (10/21 0750) Weight change:  Last BM Date: 10/13/13  PE: GEN:  NAD ABD:  Soft  Lab Results: CBC    Component Value Date/Time   WBC 13.1* 10/14/2013 0510   RBC 2.98* 10/14/2013 0510   RBC 2.51* 10/12/2013 0103   HGB 9.7* 10/14/2013 0510   HCT 28.3* 10/14/2013 0510   PLT 123* 10/14/2013 0510   MCV 95.0 10/14/2013 0510   MCH 32.6 10/14/2013 0510   MCHC 34.3 10/14/2013 0510   RDW 16.3* 10/14/2013 0510   LYMPHSABS 1.3 10/11/2013 2155   MONOABS 1.7* 10/11/2013 2155   EOSABS 0.0 10/11/2013 2155   BASOSABS 0.1 10/11/2013 2155   Assessment:  1.  Esophageal variceal bleeding, post endoscopic banding.  No further bleeding. 2.  Cirrhosis. 3.  Acute blood loss anemia.  Plan:  1.  Cipro for total of 5-7 days, in setting of cirrhosis with GI bleeding. 2.  Discontinue Protonix gtt and start Protonix 40 mg po once-a-day and continue for 4-6 weeks. 3.  Stop octreotide gtt. 4.  Advance diet. 5.  Ok from GI standpoint to discharge late today or early tomorrow.  Will need to call [facility] [phone] to arrange outpatient follow-up with Dr. Bosie Clos.  Will need repeat endoscopy in a few weeks to ensure his varices have been obliterated. 6.  Will sign-off; please call with questions; thank you for the consult.  Freddy Jaksch 10/14/2013, 9:02 AM

## 2013-10-14 NOTE — Discharge Summary (Signed)
Physician Discharge Summary  Patient ID: Adam Sanchez MRN: [id number] DOB/AGE: 35-20-1977 35 y.o.  Admit date: 10/11/2013 Discharge date: 10/14/2013    Discharge Diagnoses:  Upper GI Bleed Melena / Hematochezia Alcoholic Hepatitis Esophageal Varices Acute Blood Loss Anemia Hypotension Hyperglycemia ETOH Abuse                                                                     DISCHARGE PLAN BY DIAGNOSIS     Upper GI Bleed Melena / Hematochezia Alcoholic Hepatitis Esophageal Varices Acute Blood Loss Anemia ETOH Abuse  Discharge Plan: -Cipro for total of 5-7 days, in setting of cirrhosis with GI bleeding.  -Protonix 40 mg po once-a-day and continue for 4-6 weeks.  -Advance diet as tolerated -follow-up with Dr. Bosie Clos.  Will need repeat endoscopy in a few weeks to ensure his varices have been obliterated.  -outpt follow up arranged with Dr. Bosie Clos  Hypotension Hyperglycemia  Discharge Plan: -Resolved, no further follow up necessary at this time.                   DISCHARGE SUMMARY   Adam Sanchez is a 37 y.o. y/o male with a PMH of heavy ETOH and NSAID use who presented to [facility] from [facility] with an upper GI bleed, melena and hematemesis.  [facility] was consulted for GI bleeding.  He underwent EGD which demonstrated esophageal varices, portal gastropathy, no active bleeding. Varcieal banding performed.  Patient empirically treated with cipro for SBP prophylactic coverage.  He will continue on Cipro for 7 days total.  GI planning for return visit with EGD to reassess to ensure varices have been obliterated.  Acute bleeding stopped post EGD.  Patient stabilized and vitals . Laboratory data returned to normal limits.  LFT's elevated during admit but declining.  He was maintained on folate, thiamine, & MVI during hospitalization.  He will continue on protonix QD for 6 weeks.              SIGNIFICANT EVENTS / STUDIES:  10/18 - Admit to [facility] with  hematemesis and BRBPR  10/19 - Transfer to [facility] ICU/PCCM service  10/19 - GI consult/EGD: esophageal varices, portal gastropathy, no active bleeding. Varcieal banding performed  10/20 - transfer to SDU   LINES / TUBES:  PIV   CULTURES:  MRSA PCR 10/19 >> NEG   ANTIBIOTICS:  Cipro 10/19 (SBP prophy) 10/19 >>   CONSULTS [facility] - Dr. Bosie Clos  Discharge Exam: General: NAD, pleasant  Neuro: Cognition appears intact, no focal deficits  HEENT: No sclericterus, NCAT, EOMI, PERRL  Cardiovascular: RRR s M  Lungs: Clear anteriorly  Abdomen: mildly obese, soft, NT, +BS  Ext: no edema, warm, normal capillary refill   Filed Vitals:   10/14/13 0605 10/14/13 0750 10/14/13 0922 10/14/13 1203  BP: 135/70 123/73  125/65  Pulse: 88 88 133 82  Temp:  98.1 F (36.7 C)  98.8 F (37.1 C)  TempSrc:  Oral  Oral  Resp:    18  Height:      Weight:      SpO2: 98% 99%  97%   Discharge Labs  BMET  Recent Labs Lab 10/11/13 2155 10/13/13 0527 10/14/13 0510  NA 137 137 137  K 4.3 4.0 4.0  CL 101 106 103  CO2 21 23 25   GLUCOSE 179* 87 96  BUN 17 13 7   CREATININE 0.48* 0.56 0.58  CALCIUM 8.5 7.7* 8.1*  MG  --   --  1.7  PHOS  --   --  4.4   CBC  Recent Labs Lab 10/13/13 2000 10/14/13 0510 10/14/13 1230  HGB 9.6* 9.7* 10.6*  HCT 28.0* 28.3* 31.2*  WBC 14.3* 13.1* 15.3*  PLT 119* 123* 140*   Anti-Coagulation  Recent Labs Lab 10/11/13 2155  INR 1.40        Discharge Orders   Future Orders Complete By Expires   Call MD for:  persistant nausea and vomiting  As directed    Call MD for:  severe uncontrolled pain  As directed    Call MD for:  temperature >100.4  As directed    Call MD for:  As directed    Scheduling Instructions:     Bleeding   Diet - low sodium heart healthy  As directed    Discharge instructions  As directed    Comments:     1. No further BC Powders, Goodies, Ibuprofen / Advil or Aleve 2. Take medications as prescribed  3. No further alcohol  consumption 4.  Report to ER immediately if further bleeding   Increase activity slowly  As directed         Medication List    STOP taking these medications       GOODY HEADACHE PO     magnesium citrate Soln      TAKE these medications       ciprofloxacin 500 MG tablet  Commonly known as:  CIPRO  Take 1 tablet (500 mg total) by mouth 2 (two) times daily.     multivitamin with minerals Tabs tablet  Take 1 tablet by mouth daily.     pantoprazole 40 MG tablet  Commonly known as:  PROTONIX  Take 1 tablet (40 mg total) by mouth daily.          Disposition: Home with follow up with [facility]  Discharged Condition: Adam Sanchez has met maximum benefit of inpatient care and is medically stable and cleared for discharge.  Patient is pending follow up as above.      Time spent on disposition:  Greater than 35 minutes.   Signed: Canary Brim, NP-C [facility] Pgr: [phone] Office: [phone]

## 2013-10-14 NOTE — Care Management Note (Signed)
    Page 1 of 1   10/14/2013     10:38:05 AM   CARE MANAGEMENT NOTE 10/14/2013  Patient:  Adam Sanchez,Adam Sanchez   Account Number:  [id number]  Date Initiated:  10/13/2013  Documentation initiated by:  Junius Creamer  Subjective/Objective Assessment:   adm w hematemesis     Action/Plan:   lives w fam   Anticipated DC Date:  10/14/2013   Anticipated DC Plan:  HOME/SELF CARE      DC Planning Services  CM consult      Choice offered to / List presented to:             Status of service:   Medicare Important Message given?   (If response is "NO", the following Medicare IM given date fields will be blank) Date Medicare IM given:   Date Additional Medicare IM given:    Discharge Disposition:  HOME/SELF CARE  Per UR Regulation:  Reviewed for med. necessity/level of care/duration of stay  If discussed at Long Length of Stay Meetings, dates discussed:    Comments:  10/21 1036a debbie Adam Strnad rn,bsn spoke w pt. he lives in [place] gave hime resource list of clinics [place] gave pt w prescription discount cards also. he hopes to go home today.

## 2013-10-14 NOTE — Progress Notes (Signed)
Physical Therapy Treatment/ Discharge Patient Details Name: Adam Sanchez MRN: [id number] DOB: 01-25-1976 Today's Date: 10/14/2013 Time: 8469-6295 PT Time Calculation (min): 15 min  PT Assessment / Plan / Recommendation  History of Present Illness 71M with hx of heavy EtOH and NSAIDS use admitted in transfer from [facility] with UGIB s/p vericeal banding    PT Comments   Pt with greatly improved balance, gait and activity tolerance from yesterday. Pt able to single limb stance 10sec on LLE and only 3 sec on RLE with shaking bil LE on SLS. Pt educated for deficits with SLS and educated for safety with bathing and dressing and reports he holds rail in shower during lower body bathing. Pt able to perform grapevine gait to right and left as well as backward walking in addition to all other below listed balance testing. Pt states he feels better than yesterday, aware of deficit and agreeable to no further therapy needs acutely or at OP. Goals met and will sign off.    Follow Up Recommendations  No PT follow up     Does the patient have the potential to tolerate intense rehabilitation     Barriers to Discharge        Equipment Recommendations  None recommended by PT    Recommendations for Other Services    Frequency     Progress towards PT Goals Progress towards PT goals: Goals met and updated - see care plan  Plan Discharge plan needs to be updated    Precautions / Restrictions Precautions Precautions: None   Pertinent Vitals/Pain 90-133 with gait No pain    Mobility  Bed Mobility Supine to Sit: 6: Modified independent (Device/Increase time);HOB flat Transfers Sit to Stand: 7: Independent;From chair/3-in-1;From bed Stand to Sit: 7: Independent;To chair/3-in-1;To bed Stand Pivot Transfers: 7: Independent Ambulation/Gait Ambulation/Gait Assistance: 7: Independent Ambulation Distance (Feet): 1000 Feet Ambulation/Gait Assistance Details: steady gait, quick gait and able to complete all  head turns, change of speed and direction without LOB Gait Pattern: Within Functional Limits Gait velocity: WFL    Exercises     PT Diagnosis:    PT Problem List:   PT Treatment Interventions:     PT Goals (current goals can now be found in the care plan section)    Visit Information  Last PT Received On: 10/14/13 Assistance Needed: +1 History of Present Illness: 71M with hx of heavy EtOH and NSAIDS use admitted in transfer from [facility] with UGIB s/p vericeal banding     Subjective Data      Cognition  Cognition Arousal/Alertness: Awake/alert Behavior During Therapy: WFL for tasks assessed/performed Overall Cognitive Status: Within Functional Limits for tasks assessed    Balance  Standardized Balance Assessment Standardized Balance Assessment: Berg Balance Test Berg Balance Test Sit to Stand: Able to stand without using hands and stabilize independently Standing Unsupported: Able to stand safely 2 minutes Sitting with Back Unsupported but Feet Supported on Floor or Stool: Able to sit safely and securely 2 minutes Stand to Sit: Sits safely with minimal use of hands Transfers: Able to transfer safely, minor use of hands Standing Unsupported with Eyes Closed: Able to stand 10 seconds safely Standing Ubsupported with Feet Together: Able to place feet together independently and stand 1 minute safely From Standing, Reach Forward with Outstretched Arm: Can reach confidently >25 cm (10") From Standing Position, Pick up Object from Floor: Able to pick up shoe safely and easily From Standing Position, Turn to Look Behind Over each Shoulder: Looks behind from  both sides and weight shifts well Turn 360 Degrees: Able to turn 360 degrees safely in 4 seconds or less Standing Unsupported, Alternately Place Feet on Step/Stool: Able to stand independently and safely and complete 8 steps in 20 seconds Standing Unsupported, One Foot in Front: Able to place foot tandem independently and hold 30  seconds Standing on One Leg: Able to lift leg independently and hold equal to or more than 3 seconds Total Score: 54  End of Session PT - End of Session Equipment Utilized During Treatment: Gait belt Activity Tolerance: Patient tolerated treatment well Patient left: in chair;with call bell/phone within reach;with nursing/sitter in room Nurse Communication: Mobility status   GP     Delorse Lek 10/14/2013, 9:24 AM Delaney Meigs, PT [phone]

## 2013-10-20 NOTE — Discharge Summary (Signed)
Patient seen and examined, agree with above note.  I dictated the care and orders written for this patient under my direction.  Carmesha Morocco G Evyn Putzier, MD [phone] 

## 2013-12-03 ENCOUNTER — Ambulatory Visit (HOSPITAL_COMMUNITY): Admission: RE | Admit: 2013-12-03 | Payer: Self-pay | Source: Ambulatory Visit | Admitting: Gastroenterology

## 2013-12-03 ENCOUNTER — Encounter (HOSPITAL_COMMUNITY): Admission: RE | Payer: Self-pay | Source: Ambulatory Visit

## 2013-12-03 SURGERY — EGD (ESOPHAGOGASTRODUODENOSCOPY)
Anesthesia: Moderate Sedation

## 2014-12-09 IMAGING — CR DG ABDOMEN ACUTE W/ 1V CHEST
3 series · 3 of 3 positions shown · non-contrast
Comparison: None.

CLINICAL DATA: Hematemesis.

EXAM:
ACUTE ABDOMEN SERIES (ABDOMEN 2 VIEW & CHEST 1 VIEW)

[view not recorded (1 of 3)]
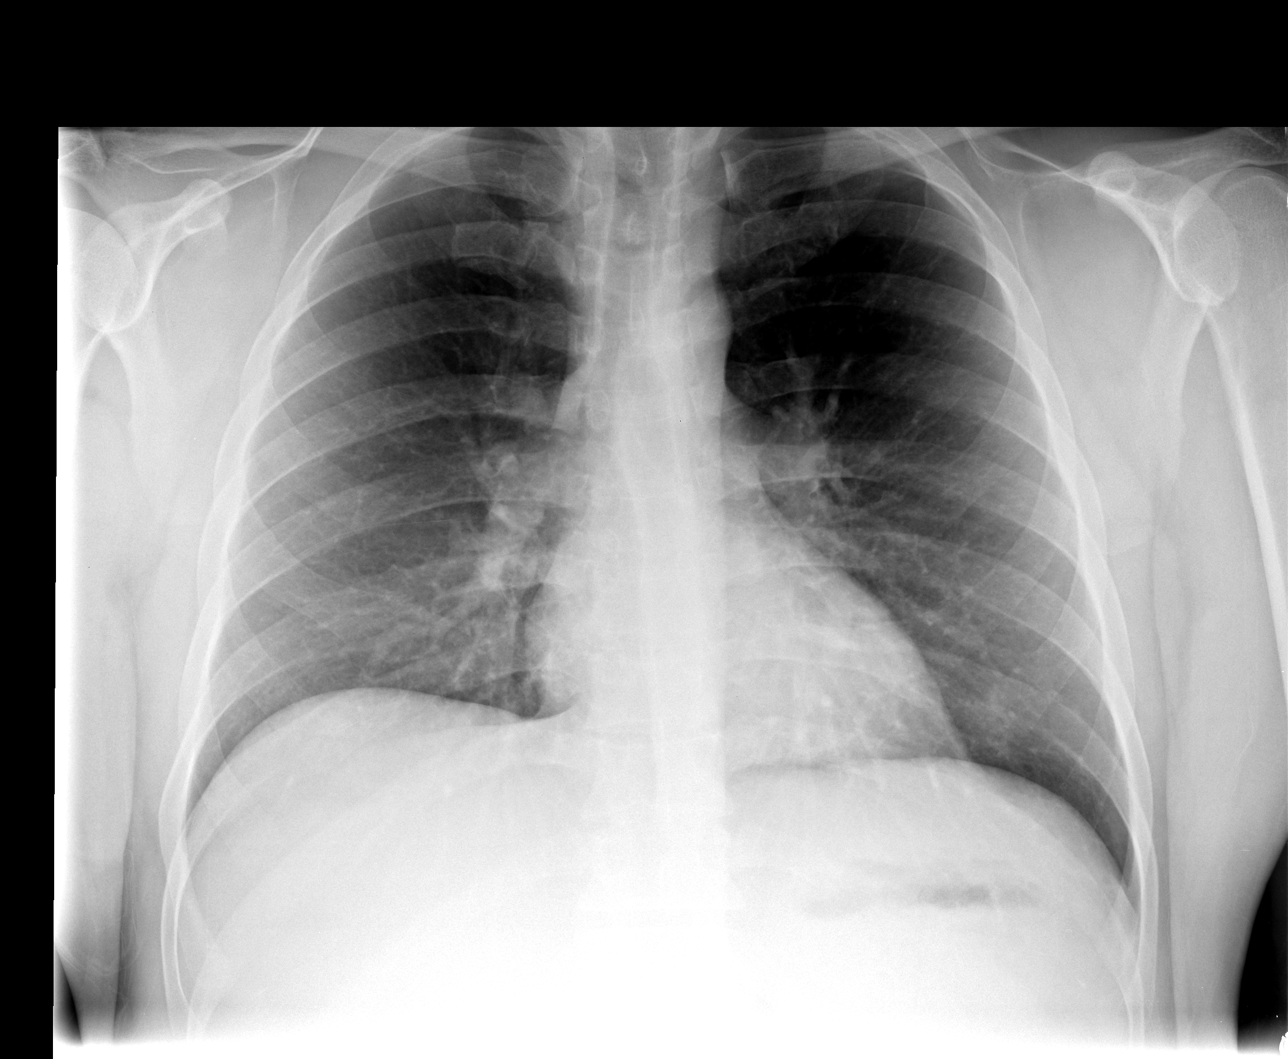

[view not recorded (2 of 3)]
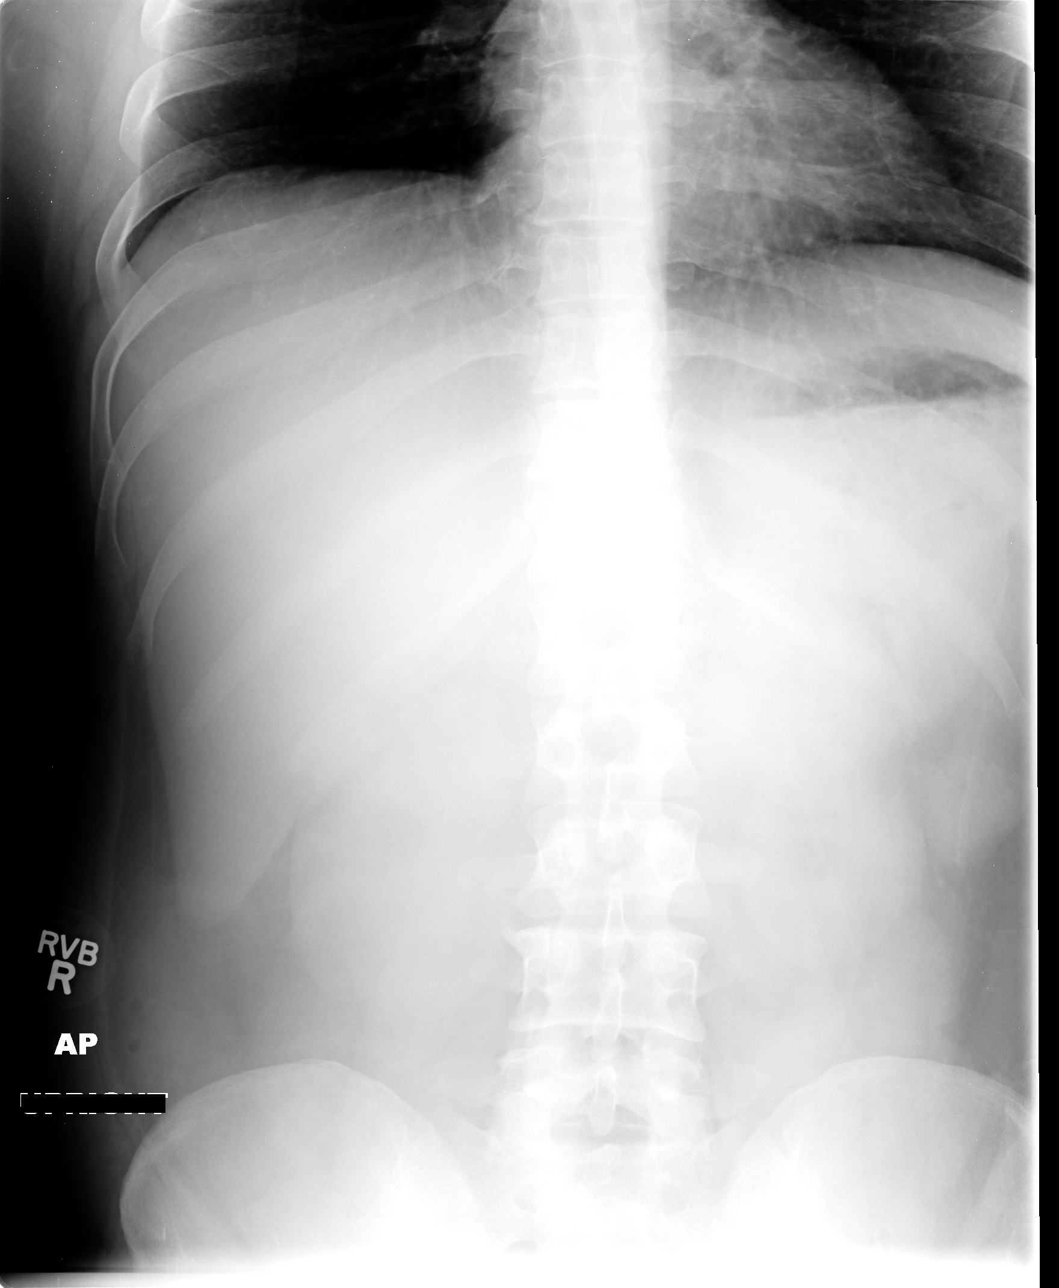

[view not recorded (3 of 3)]
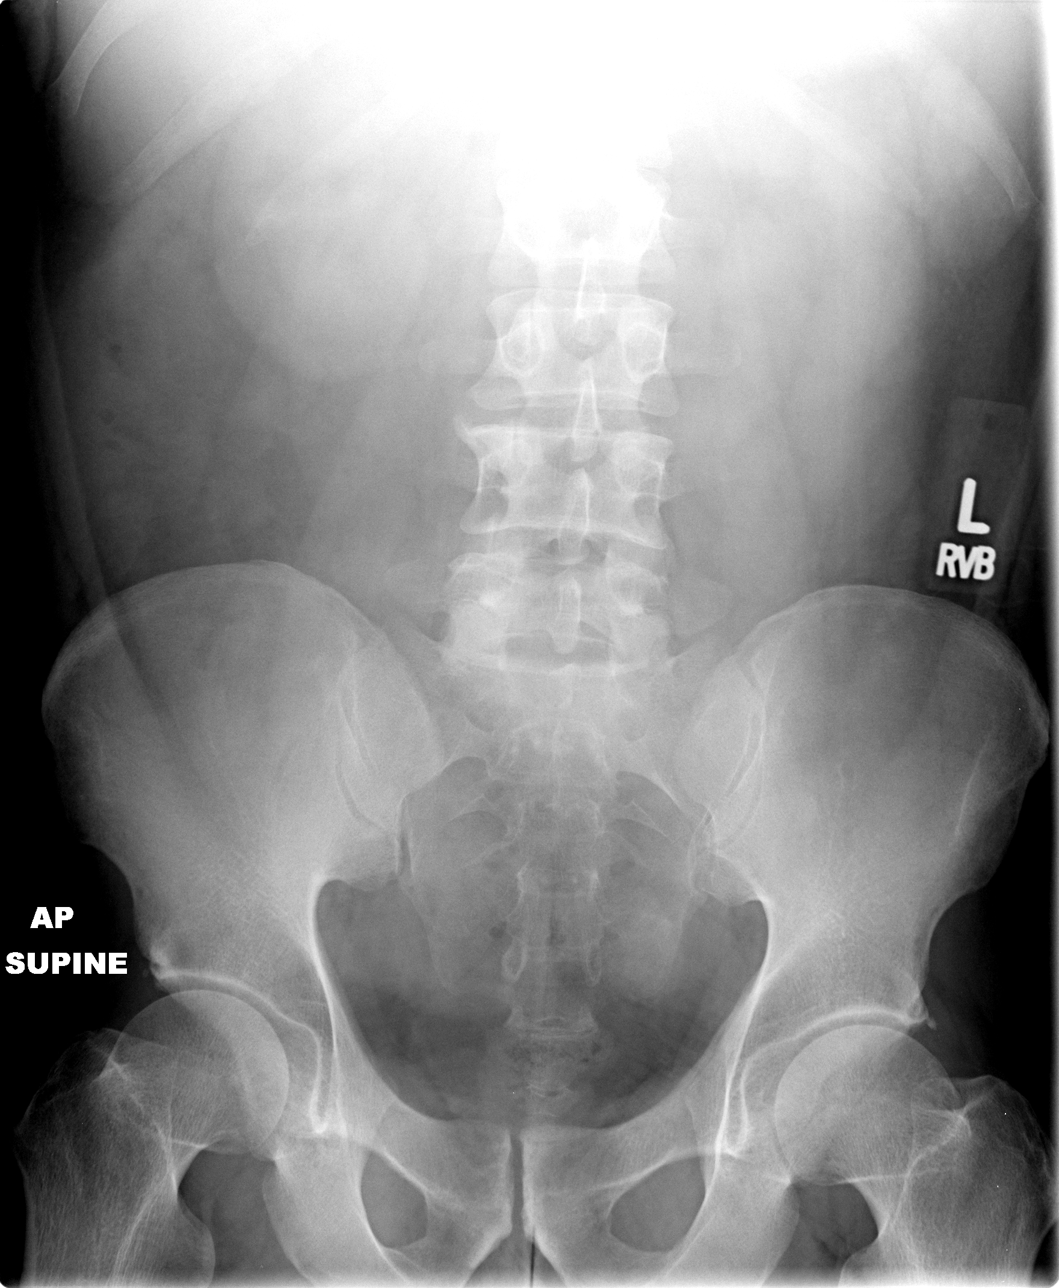

[3 of 3 positions shown; findings below may reference images not displayed]

FINDINGS: Normal cardiac silhouette. Lungs are clear. No free air beneath the
hemidiaphragms.

Is a paucity of gas within the abdomen. Small a gas in the stomach.
Rounded density along the left abdomen measuring 3.8 cm likely
represents stool. Normal renal shadows. No pathologic
calcifications.
IMPRESSION: 1. No acute cardiopulmonary process.

2. No evidence of bowel obstruction or  intraperitoneal free air.

3. Paucity of gas in the abdomen can be seen with persistent
vomiting.

## 2016-03-10 ENCOUNTER — Ambulatory Visit: Payer: Self-pay | Admitting: Pediatrics

## 2016-03-22 ENCOUNTER — Encounter (INDEPENDENT_AMBULATORY_CARE_PROVIDER_SITE_OTHER): Payer: Self-pay

## 2016-03-22 ENCOUNTER — Encounter: Payer: Self-pay | Admitting: Family Medicine

## 2016-03-22 ENCOUNTER — Ambulatory Visit (INDEPENDENT_AMBULATORY_CARE_PROVIDER_SITE_OTHER): Payer: Self-pay | Admitting: Family Medicine

## 2016-03-22 VITALS — BP 121/74 | HR 80 | Temp 97.8°F | Ht 67.0 in | Wt 198.0 lb

## 2016-03-22 DIAGNOSIS — Z0000 Encounter for general adult medical examination without abnormal findings: Secondary | ICD-10-CM

## 2016-03-22 DIAGNOSIS — B354 Tinea corporis: Secondary | ICD-10-CM

## 2016-03-22 MED ORDER — CLOTRIMAZOLE-BETAMETHASONE 1-0.05 % EX CREA: 1.0000 "application " | TOPICAL_CREAM | Freq: Two times a day (BID) | CUTANEOUS | Status: DC

## 2016-03-22 NOTE — Progress Notes (Signed)
BP 121/74 mmHg  Pulse 80  Temp(Src) 97.8 F (36.6 C) (Oral)  Ht '5\' 7"'$  (1.702 m)  Wt 198 lb (89.812 kg)  BMI 31.00 kg/m2   Subjective:    Patient ID: Adam Sanchez, male    DOB: 1976-11-05, 40 y.o.   MRN: [id number]  HPI: Adam Sanchez is a 40 y.o. male presenting on 03/22/2016 for Annual Exam   HPI Annual well adult exam Patient is coming in today for his annual well adult exam and labs. He has not had them done in quite a few years. He denies any chest pain, shortness of breath, headaches or vision issues, abdominal complaints, diarrhea, nausea, vomiting, or joint issues.   Rash Patient has been having a rash near his beltline extending down into his groin that is pink and pruritic and scaly. He is attempting to use an over-the-counter anti-itch creams and they have helped reduce the itch but not the rash itself. He's never had this until the past few weeks. He denies any drainage out of them purulence. He denies any warmth or fever or chills.  Relevant past medical, surgical, family and social history reviewed and updated as indicated. Interim medical history since our last visit reviewed. Allergies and medications reviewed and updated.  Review of Systems  Constitutional: Negative for fever and chills.  HENT: Negative for congestion, ear discharge and ear pain.   Eyes: Negative for discharge and visual disturbance.  Respiratory: Negative for shortness of breath and wheezing.   Cardiovascular: Negative for chest pain and leg swelling.  Gastrointestinal: Negative for abdominal pain, diarrhea and constipation.  Genitourinary: Negative for difficulty urinating.  Musculoskeletal: Negative for back pain and gait problem.  Skin: Positive for rash.  Neurological: Negative for syncope, light-headedness and headaches.  All other systems reviewed and are negative.   Per HPI unless specifically indicated above     Medication List       This list is accurate as of: 03/22/16  5:17  PM.  Always use your most recent med list.               clotrimazole-betamethasone cream  Commonly known as:  LOTRISONE  Apply 1 application topically 2 (two) times daily.           Objective:    BP 121/74 mmHg  Pulse 80  Temp(Src) 97.8 F (36.6 C) (Oral)  Ht '5\' 7"'$  (1.702 m)  Wt 198 lb (89.812 kg)  BMI 31.00 kg/m2  Wt Readings from Last 3 Encounters:  03/22/16 198 lb (89.812 kg)  10/12/13 185 lb (83.915 kg)    Physical Exam  Constitutional: He is oriented to person, place, and time. He appears well-developed and well-nourished. No distress.  HENT:  Right Ear: External ear normal.  Left Ear: External ear normal.  Nose: Nose normal.  Mouth/Throat: Oropharynx is clear and moist.  Eyes: Conjunctivae and EOM are normal. Pupils are equal, round, and reactive to light. Right eye exhibits no discharge. No scleral icterus.  Neck: Neck supple. No thyromegaly present.  Cardiovascular: Normal rate, regular rhythm, normal heart sounds and intact distal pulses.   No murmur heard. Pulmonary/Chest: Effort normal and breath sounds normal. No respiratory distress. He has no wheezes.  Abdominal: Soft. Bowel sounds are normal. He exhibits no distension. There is no tenderness. There is no rebound.  Musculoskeletal: Normal range of motion. He exhibits no edema.  Lymphadenopathy:    He has no cervical adenopathy.  Neurological: He is alert and oriented to person, place,  and time. Coordination normal.  Skin: Skin is warm and dry. Rash noted. Rash is maculopapular (Patient has scaly rash that is covering his belt line anteriorly and extending slightly down into his groin. The rash is very pink with satellite lesions and has excoriations in the scale to it as well.). He is not diaphoretic.  Psychiatric: He has a normal mood and affect. His behavior is normal. Thought content normal.  Nursing note and vitals reviewed.       Assessment & Plan:   Problem List Items Addressed This Visit     None    Visit Diagnoses    Well adult exam    -  Primary    Relevant Orders    CMP14+EGFR    Lipid panel    CBC with Differential/Platelet    Tinea corporis        Relevant Medications    clotrimazole-betamethasone (LOTRISONE) cream        Follow up plan: Return if symptoms worsen or fail to improve.  Counseling provided for all of the vaccine components Orders Placed This Encounter  Procedures  . CMP14+EGFR  . Lipid panel  . CBC with Differential/Platelet    Caryl Pina, MD [facility] 03/22/2016, 5:17 PM

## 2016-11-17 ENCOUNTER — Other Ambulatory Visit: Payer: Self-pay | Admitting: *Deleted

## 2016-11-17 MED ORDER — FLUCONAZOLE 150 MG PO TABS: 150.0000 mg | ORAL_TABLET | ORAL | 0 refills | Status: DC

## 2016-11-18 ENCOUNTER — Other Ambulatory Visit: Payer: Self-pay | Admitting: *Deleted

## 2016-11-18 ENCOUNTER — Other Ambulatory Visit: Payer: Self-pay

## 2016-11-18 ENCOUNTER — Other Ambulatory Visit: Payer: Self-pay | Admitting: Family Medicine

## 2016-11-18 DIAGNOSIS — Z13220 Encounter for screening for lipoid disorders: Secondary | ICD-10-CM

## 2016-11-18 DIAGNOSIS — F1010 Alcohol abuse, uncomplicated: Secondary | ICD-10-CM

## 2016-11-18 DIAGNOSIS — Z131 Encounter for screening for diabetes mellitus: Secondary | ICD-10-CM

## 2016-11-18 DIAGNOSIS — Z0000 Encounter for general adult medical examination without abnormal findings: Secondary | ICD-10-CM

## 2016-11-19 LAB — CBC WITH DIFFERENTIAL/PLATELET
BASOS ABS: 0 10*3/uL (ref 0.0–0.2)
BASOS: 0 %
EOS (ABSOLUTE): 0.3 10*3/uL (ref 0.0–0.4)
Eos: 3 %
Hematocrit: 46.4 % (ref 37.5–51.0)
Hemoglobin: 16.4 g/dL (ref 12.6–17.7)
IMMATURE GRANS (ABS): 0 10*3/uL (ref 0.0–0.1)
IMMATURE GRANULOCYTES: 0 %
LYMPHS: 30 %
Lymphocytes Absolute: 3.3 10*3/uL — ABNORMAL HIGH (ref 0.7–3.1)
MCH: 31.2 pg (ref 26.6–33.0)
MCHC: 35.3 g/dL (ref 31.5–35.7)
MCV: 88 fL (ref 79–97)
MONOS ABS: 0.6 10*3/uL (ref 0.1–0.9)
Monocytes: 6 %
Neutrophils Absolute: 6.7 10*3/uL (ref 1.4–7.0)
Neutrophils: 61 %
PLATELETS: 152 10*3/uL (ref 150–379)
RBC: 5.26 x10E6/uL (ref 4.14–5.80)
RDW: 12.6 % (ref 12.3–15.4)
WBC: 11 10*3/uL — AB (ref 3.4–10.8)

## 2016-11-19 LAB — CMP14+EGFR
ALBUMIN: 4.3 g/dL (ref 3.5–5.5)
ALT: 30 IU/L (ref 0–44)
AST: 22 IU/L (ref 0–40)
Albumin/Globulin Ratio: 1.6 (ref 1.2–2.2)
Alkaline Phosphatase: 87 IU/L (ref 39–117)
BILIRUBIN TOTAL: 0.5 mg/dL (ref 0.0–1.2)
BUN / CREAT RATIO: 16 (ref 9–20)
BUN: 12 mg/dL (ref 6–20)
CHLORIDE: 102 mmol/L (ref 96–106)
CO2: 23 mmol/L (ref 18–29)
Calcium: 9 mg/dL (ref 8.7–10.2)
Creatinine, Ser: 0.74 mg/dL — ABNORMAL LOW (ref 0.76–1.27)
GFR calc non Af Amer: 117 mL/min/{1.73_m2} (ref >59)
GFR, EST AFRICAN AMERICAN: 135 mL/min/{1.73_m2} (ref >59)
GLOBULIN, TOTAL: 2.7 g/dL (ref 1.5–4.5)
Glucose: 98 mg/dL (ref 65–99)
Potassium: 4.2 mmol/L (ref 3.5–5.2)
SODIUM: 143 mmol/L (ref 134–144)
Total Protein: 7 g/dL (ref 6.0–8.5)

## 2016-11-19 LAB — LIPID PANEL
CHOLESTEROL TOTAL: 182 mg/dL (ref 100–199)
Chol/HDL Ratio: 4 ratio units (ref 0.0–5.0)
HDL: 46 mg/dL (ref >39)
LDL Calculated: 117 mg/dL — ABNORMAL HIGH (ref 0–99)
Triglycerides: 93 mg/dL (ref 0–149)
VLDL Cholesterol Cal: 19 mg/dL (ref 5–40)

## 2016-11-21 ENCOUNTER — Telehealth: Payer: Self-pay | Admitting: Family Medicine

## 2016-11-21 NOTE — Telephone Encounter (Signed)
LM on wife's VM.

## 2016-12-21 ENCOUNTER — Encounter: Payer: Self-pay | Admitting: Family Medicine

## 2016-12-26 ENCOUNTER — Encounter: Payer: Self-pay | Admitting: Pediatrics

## 2016-12-26 ENCOUNTER — Other Ambulatory Visit: Payer: Self-pay | Admitting: Family Medicine

## 2016-12-26 MED ORDER — FLUCONAZOLE 150 MG PO TABS: 150.0000 mg | ORAL_TABLET | ORAL | 0 refills | Status: DC

## 2017-03-02 ENCOUNTER — Encounter: Payer: Self-pay | Admitting: Family Medicine

## 2017-10-05 ENCOUNTER — Ambulatory Visit (INDEPENDENT_AMBULATORY_CARE_PROVIDER_SITE_OTHER): Payer: Self-pay | Admitting: Family Medicine

## 2017-10-05 ENCOUNTER — Encounter: Payer: Self-pay | Admitting: Family Medicine

## 2017-10-05 VITALS — BP 127/73 | HR 59 | Temp 97.9°F | Ht 67.0 in | Wt 195.0 lb

## 2017-10-05 DIAGNOSIS — B354 Tinea corporis: Secondary | ICD-10-CM

## 2017-10-05 MED ORDER — NYSTATIN 100000 UNIT/GM EX OINT: 1.0000 "application " | TOPICAL_OINTMENT | Freq: Two times a day (BID) | CUTANEOUS | 0 refills | Status: DC

## 2017-10-05 MED ORDER — FLUCONAZOLE 150 MG PO TABS: 150.0000 mg | ORAL_TABLET | ORAL | 1 refills | Status: DC

## 2017-10-05 NOTE — Progress Notes (Signed)
   BP 127/73   Pulse (!) 59   Temp 97.9 F (36.6 C) (Oral)   Ht  (1.702 m)   Wt 195 lb (88.5 kg)   BMI 30.54 kg/m    Subjective:    Patient ID: Adam Sanchez, male    DOB: 1976-06-21, 41 y.o.   MRN: [id number]  HPI: Adam Sanchez is a 41 y.o. male presenting on 10/05/2017 for Rash (still on back, waist and groin; medication given before worked but it has came back)   HPI Pruritic rash Patient has a pruritic rash that is on his back waist and groin like he had previously. He says the medication creams that we gave him helped greatly but then it came back again. He denies any drainage from the rash but she says is very pruritic. He denies any pain or redness or warmth associated with it. The rash mostly is in his groin region but does extend some to his back and waist line.  Relevant past medical, surgical, family and social history reviewed and updated as indicated. Interim medical history since our last visit reviewed. Allergies and medications reviewed and updated.  Review of Systems  Constitutional: Negative for chills and fever.  Respiratory: Negative for shortness of breath and wheezing.   Cardiovascular: Negative for chest pain and leg swelling.  Musculoskeletal: Negative for back pain and gait problem.  Skin: Positive for rash. Negative for color change and wound.  All other systems reviewed and are negative.   Per HPI unless specifically indicated above        Objective:    BP 127/73   Pulse (!) 59   Temp 97.9 F (36.6 C) (Oral)   Ht  (1.702 m)   Wt 195 lb (88.5 kg)   BMI 30.54 kg/m   Wt Readings from Last 3 Encounters:  10/05/17 195 lb (88.5 kg)  03/22/16 198 lb (89.8 kg)  10/12/13 185 lb (83.9 kg)    Physical Exam  Constitutional: He is oriented to person, place, and time. He appears well-developed and well-nourished. No distress.  Eyes: Conjunctivae are normal. No scleral icterus.  Musculoskeletal: Normal range of motion.  Neurological: He  is alert and oriented to person, place, and time. Coordination normal.  Skin: Skin is warm and dry. Rash noted. Rash is maculopapular (Maculopapular pink scaly rash in groin waistline and lower back, consistent with fungal dermatitis). He is not diaphoretic.  Psychiatric: He has a normal mood and affect. His behavior is normal.  Nursing note and vitals reviewed.       Assessment & Plan:   Problem List Items Addressed This Visit    None    Visit Diagnoses    Tinea corporis    -  Primary   Relevant Medications   fluconazole (DIFLUCAN) 150 MG tablet   nystatin ointment (MYCOSTATIN)       Follow up plan: Return if symptoms worsen or fail to improve.  Counseling provided for all of the vaccine components No orders of the defined types were placed in this encounter.   Arville Care, MD [facility] 10/05/2017, 11:45 AM

## 2017-10-05 NOTE — Patient Instructions (Signed)
Pitiriasis versicolor (Tinea Versicolor) La pitiriasis versicolor es una infeccin en la piel que est causada por un tipo de levadura (hongo). Produce una erupcin cutnea que se manifiesta como manchas claras u oscuras. Es ms frecuente que aparezca en la piel del pecho, la espalda, el cuello o la parte superior de los brazos. Habitualmente, la afeccin no causa otros problemas. En la mayora de los casos, desaparece en unas pocas semanas con un tratamiento. La infeccin no puede contagiarse de una persona a la otra. CUIDADOS EN EL HOGAR  Tome los medicamentos solamente como se lo haya indicado el mdico.  Frtese la piel todos los das con un champ anticaspa como se lo haya indicado el mdico.  No se rasque la piel en la zona de la erupcin.  Evite los lugares clidos y hmedos.  No use cabinas de bronceado.  Trate de no transpirar demasiado. SOLICITE AYUDA SI:  Los sntomas empeoran.  Tiene fiebre.  Presenta enrojecimiento, hinchazn o dolor en la zona de la erupcin cutnea.  Observa lquido, sangre o pus que salen de la erupcin cutnea.  La erupcin cutnea regresa despus del tratamiento. Esta informacin no tiene como fin reemplazar el consejo del mdico. Asegrese de hacerle al mdico cualquier pregunta que tenga. Document Released: 01/13/2011 Document Revised: 01/01/2015 Document Reviewed: 09/22/2014 Elsevier Interactive Patient Education  2018 Elsevier Inc.  

## 2018-01-06 ENCOUNTER — Emergency Department (HOSPITAL_COMMUNITY)
Admission: EM | Admit: 2018-01-06 | Discharge: 2018-01-06 | Disposition: A | Discharge status: Home / Self Care | Payer: Self-pay | Attending: Emergency Medicine | Admitting: Emergency Medicine

## 2018-01-06 ENCOUNTER — Other Ambulatory Visit: Payer: Self-pay

## 2018-01-06 ENCOUNTER — Encounter (HOSPITAL_COMMUNITY): Payer: Self-pay | Admitting: Emergency Medicine

## 2018-01-06 DIAGNOSIS — H60392 Other infective otitis externa, left ear: Secondary | ICD-10-CM | POA: Insufficient documentation

## 2018-01-06 DIAGNOSIS — F17210 Nicotine dependence, cigarettes, uncomplicated: Secondary | ICD-10-CM | POA: Insufficient documentation

## 2018-01-06 HISTORY — DX: Esophageal varices without bleeding: I85.00

## 2018-01-06 MED ORDER — AMOXICILLIN-POT CLAVULANATE 875-125 MG PO TABS: 1.0000 | ORAL_TABLET | Freq: Two times a day (BID) | ORAL | 0 refills | Status: DC

## 2018-01-06 MED ORDER — AMOXICILLIN-POT CLAVULANATE 875-125 MG PO TABS
1.0000 | ORAL_TABLET | Freq: Once | ORAL | Status: AC
  Administered 2018-01-06: 1 via ORAL
  Filled 2018-01-06: qty 1

## 2018-01-06 MED ORDER — KETOROLAC TROMETHAMINE 30 MG/ML IJ SOLN
15.0000 mg | Freq: Once | INTRAMUSCULAR | Status: AC
  Administered 2018-01-06: 15 mg via INTRAMUSCULAR
  Filled 2018-01-06: qty 1

## 2018-01-06 MED ORDER — ONDANSETRON 4 MG PO TBDP: 4.0000 mg | ORAL_TABLET | Freq: Three times a day (TID) | ORAL | 0 refills | Status: DC | PRN

## 2018-01-06 NOTE — Discharge Instructions (Signed)
In addition to the prescribed medication, please use ibuprofen, 600 mg, 3 times daily for the next 3 days for additional relief.  Please be sure to follow-up with your physician within the next 2 or 3 days for repeat evaluation.  Return here for concerning changes in your condition.

## 2018-01-06 NOTE — ED Provider Notes (Signed)
[facility] EMERGENCY DEPARTMENT Provider Note   CSN: [id number] Arrival date & time: 01/06/18  1144     History   Chief Complaint Chief Complaint  Patient presents with  . Generalized Body Aches    HPI Adam Sanchez is a 42 y.o. male.  HPI Patient presents with concern of pain all over, and is focal discomfort about his left lateral face, as well as fever, nausea. Onset was 2 or 3 days ago, and since onset symptoms been persistent, with worsening pain and swelling about the left ear. Fever improved transiently with Tylenol. Patient is here with his girlfriend or wife who assists with the HPI. She notes that she had recent episode of strep throat, and soon after she developed that illness, the patient became ill as well. The patient himself denies any sore throat, chest pain, dyspnea, cough. Does state that he feels sore, and tired all over. He denies hearing loss. Patient is an immigrant from [place], states that his vaccines are up-to-date as far as he knows. Patient is a former drinker, but has not had alcohol in a proximally 5 years   Past Medical History:  Diagnosis Date  . Alcoholism (HCC)   . Esophageal varices (HCC)   . Seizures [facility])     Patient Active Problem List   Diagnosis Date Noted  . Hematemesis 10/12/2013  . GI bleed 10/12/2013  . Acute blood loss anemia 10/12/2013  . Alcohol abuse 10/12/2013    Past Surgical History:  Procedure Laterality Date  . ESOPHAGOGASTRODUODENOSCOPY N/A 10/12/2013   Procedure: ESOPHAGOGASTRODUODENOSCOPY (EGD);  Surgeon: Shirley Friar, MD;  Location: [facility] ENDOSCOPY;  Service: Endoscopy;  Laterality: N/A;       Home Medications    Prior to Admission medications   Medication Sig Start Date End Date Taking? Authorizing Provider  fluconazole (DIFLUCAN) 150 MG tablet Take 1 tablet (150 mg total) by mouth once a week. 10/05/17   Dettinger, Elige Radon, MD  nystatin ointment (MYCOSTATIN) Apply 1 application topically 2  (two) times daily. 10/05/17   Dettinger, Elige Radon, MD    Family History History reviewed. No pertinent family history.  Social History Social History   Tobacco Use  . Smoking status: Current Every Day Smoker    Packs/day: 0.50    Years: 10.00    Pack years: 5.00    Types: Cigarettes  . Smokeless tobacco: Never Used  Substance Use Topics  . Alcohol use: No    Comment: none since 2014  . Drug use: No     Allergies   Patient has no known allergies.   Review of Systems Review of Systems  Constitutional:       Per HPI, otherwise negative  HENT:       Per HPI, otherwise negative  Eyes: Negative for visual disturbance.  Respiratory:       Per HPI, otherwise negative  Cardiovascular:       Per HPI, otherwise negative  Gastrointestinal: Positive for nausea. Negative for vomiting.  Endocrine:       Negative aside from HPI  Genitourinary:       Neg aside from HPI   Musculoskeletal:       Per HPI, otherwise negative  Skin: Positive for color change.  Allergic/Immunologic: Negative for immunocompromised state.  Neurological: Negative for syncope.     Physical Exam Updated Vital Signs BP 137/80 (BP Location: Right Arm)   Pulse (!) 101   Temp 98.9 F (37.2 C) (Oral)   Resp 16  Ht 5\' 6"  (1.676 m)   Wt 86.2 kg (190 lb)   SpO2 100%   BMI 30.67 kg/m   Physical Exam  Constitutional: He is oriented to person, place, and time. He appears well-developed. No distress.  HENT:  Head: Normocephalic and atraumatic.    Right Ear: Tympanic membrane normal.  Left Ear: Tympanic membrane normal. There is swelling and tenderness. There is mastoid tenderness. Tympanic membrane is not injected, not scarred, not perforated, not erythematous, not retracted and not bulging.  No middle ear effusion. No hemotympanum.  Ears:  Eyes: Conjunctivae and EOM are normal.  Cardiovascular: Normal rate and regular rhythm.  Pulmonary/Chest: Effort normal. No stridor. No respiratory distress.    Abdominal: He exhibits no distension.  Musculoskeletal: He exhibits no edema.  Neurological: He is alert and oriented to person, place, and time.  Skin: Skin is warm and dry.  Psychiatric: He has a normal mood and affect.  Nursing note and vitals reviewed.    ED Treatments / Results  Labs (all labs ordered are listed, but only abnormal results are displayed) Labs Reviewed - No data to display  EKG  EKG Interpretation None       Radiology No results found.  Procedures Procedures (including critical care time)  Medications Ordered in ED Medications  amoxicillin-clavulanate (AUGMENTIN) 875-125 MG per tablet 1 tablet (not administered)  ketorolac (TORADOL) 30 MG/ML injection 15 mg (not administered)     Initial Impression / Assessment and Plan / ED Course  I have reviewed the triage vital signs and the nursing notes.  Pertinent labs & imaging results that were available during my care of the patient were reviewed by me and considered in my medical decision making (see chart for details).  Vesely well male presents with new left facial pain, swelling, and on exam is awake and alert, but has physical exam findings concerning for otitis externa, with surrounding inflammatory changes as well. No oral pharyngeal compromise, lesions. No parotitis suggesting mumps.  Patient is hemodynamically stable, awake, alert, appropriate for a course of antibiotics per Patient has a primary care physician with whom he will follow-up in 2 or 3 days for repeat evaluation.   Final Clinical Impressions(s) / ED Diagnoses  Otitis externa   Gerhard MunchLockwood, Clarion Mooneyhan, MD 01/06/18 1232

## 2018-01-06 NOTE — ED Notes (Signed)
Pt and SO report that they do not have immunizations "for religious reasons"   There is swelling to pt's L neck   Pt states he does not know if he has a bad tooth on that side  Swelling started yesterday along w body aches

## 2018-01-06 NOTE — ED Triage Notes (Addendum)
Pt reports left ear pain/swelling,fever, body aches for last several days. Moderate swelling noted to left ear and left side of neck. Pt reports has been taking tylenol every four hours and reports "fever broke" last night. Pt denies any known injury or insect bite.

## 2018-01-06 NOTE — ED Notes (Signed)
Dr L in to assess 

## 2018-07-06 ENCOUNTER — Encounter: Payer: Self-pay | Admitting: Family Medicine

## 2018-07-06 ENCOUNTER — Ambulatory Visit (INDEPENDENT_AMBULATORY_CARE_PROVIDER_SITE_OTHER): Payer: Self-pay | Admitting: Family Medicine

## 2018-07-06 VITALS — BP 136/68 | HR 59 | Temp 97.0°F | Ht 66.0 in | Wt 195.6 lb

## 2018-07-06 DIAGNOSIS — M791 Myalgia, unspecified site: Secondary | ICD-10-CM

## 2018-07-06 MED ORDER — PREDNISONE 10 MG PO TABS: ORAL_TABLET | ORAL | 0 refills | Status: DC

## 2018-07-06 NOTE — Patient Instructions (Signed)
Drink 2 bottles (1/2 liter each) then one bottle of  gatorade or similar drink several times through the day to stay hydrated. Sodas actual dehydrate the body, so avoid them when working in the heat.

## 2018-07-06 NOTE — Progress Notes (Signed)
Subjective:  Patient ID: Adam Sanchez, male    DOB: May 22, 1976  Age: 42 y.o. MRN: [id number]  CC: Generalized Body Aches (2 wks, no other symptoms, does work outside not sure about tick)   HPI Adam Sanchez presents for 2 weeks of muscle aches between the joints.  It involves the calves and thighs as well as the upper and lower arms and back.  His wife says his appetite has not been as good although he is not noticed that so much.  Much of the history is given by his wife.  He notes that he drinks a lot of soda at work because he cannot drink enough water to stay hydrated because it would make him throw up.  He denies fever chills and sweats.  He is not been nauseous or vomiting.  There is a question of tick bite but he eats a lot of red meat particularly after a cookout 5 days ago he did not throw up though he ate a "ton" of red meat.  Wife was concerned about alpha gal.  He had some rash after a cookout she describes it as kind of like hives but not exactly.  He had some red blotches on his torso.  These were pruritic.  They have resolved.  There is still a little bit of itching.  Depression screen [facility] 2/9 07/06/2018 10/05/2017 03/22/2016  Decreased Interest 0 0 0  Down, Depressed, Hopeless 0 0 0  PHQ - 2 Score 0 0 0    History Adam Sanchez has a past medical history of Alcoholism ([place]), Esophageal varices ([place]), and Seizures ([place]).   He has a past surgical history that includes Esophagogastroduodenoscopy (N/A, 10/12/2013).   His family history is not on file.He reports that he has been smoking cigarettes.  He has a 5.00 pack-year smoking history. He has never used smokeless tobacco. He reports that he drinks alcohol. He reports that he does not use drugs.    ROS Review of Systems  Constitutional: Positive for appetite change. Negative for activity change, chills, diaphoresis, fatigue and fever.  HENT: Negative.   Eyes: Negative for visual disturbance.  Respiratory: Negative for cough and  shortness of breath.   Cardiovascular: Negative for chest pain.  Gastrointestinal: Negative for abdominal pain, diarrhea, nausea and vomiting.  Genitourinary: Negative for difficulty urinating.  Musculoskeletal: Positive for myalgias. Negative for arthralgias.  Skin: Negative for rash.  Neurological: Negative for weakness and headaches.    Objective:  BP 136/68   Pulse (!) 59   Temp (!) 97 F (36.1 C) (Oral)   Ht '5\' 6"'$  (1.676 m)   Wt 195 lb 9.6 oz (88.7 kg)   BMI 31.57 kg/m   BP Readings from Last 3 Encounters:  07/06/18 136/68  01/06/18 137/80  10/05/17 127/73    Wt Readings from Last 3 Encounters:  07/06/18 195 lb 9.6 oz (88.7 kg)  01/06/18 190 lb (86.2 kg)  10/05/17 195 lb (88.5 kg)     Physical Exam  Constitutional: He is oriented to person, place, and time. He appears well-developed and well-nourished. No distress.  HENT:  Head: Normocephalic and atraumatic.  Right Ear: Tympanic membrane and external ear normal. No decreased hearing is noted.  Left Ear: Tympanic membrane and external ear normal. No decreased hearing is noted.  Mouth/Throat: No oropharyngeal exudate or posterior oropharyngeal erythema.  Eyes: Pupils are equal, round, and reactive to light. EOM are normal.  Neck: Normal range of motion. Neck supple. No thyromegaly present.  Cardiovascular: Normal  rate and regular rhythm.  No murmur heard. Pulmonary/Chest: Breath sounds normal. No respiratory distress.  Abdominal: Soft. Bowel sounds are normal. He exhibits no distension and no mass. There is no tenderness. There is no rebound.  Musculoskeletal: Normal range of motion. He exhibits no edema, tenderness or deformity.  Lymphadenopathy:    He has no cervical adenopathy.  Neurological: He is alert and oriented to person, place, and time. No cranial nerve deficit. He exhibits normal muscle tone. Coordination normal.  Skin: Skin is warm and dry.  Psychiatric: He has a normal mood and affect. His behavior  is normal.  Vitals reviewed.     Assessment & Plan:   Adam Sanchez was seen today for generalized body aches.  Diagnoses and all orders for this visit:  Myalgia -     CMP14+EGFR -     Phosphorus -     Magnesium -     CBC with Differential/Platelet  Other orders -     predniSONE (DELTASONE) 10 MG tablet; Take 5 daily for 2 days followed by 4,3,2 and 1 for 2 days each.       I have discontinued Anastasio Hollingworth's fluconazole, nystatin ointment, amoxicillin-clavulanate, and ondansetron. I am also having him start on predniSONE.  Allergies as of 07/06/2018   No Known Allergies     Medication List        Accurate as of 07/06/18  9:13 AM. Always use your most recent med list.          predniSONE 10 MG tablet Commonly known as:  DELTASONE Take 5 daily for 2 days followed by 4,3,2 and 1 for 2 days each.      Drink 2 bottles (1/2 liter each) then one bottle of  gatorade or similar drink several times through the day to stay hydrated. Sodas actual dehydrate the body, so avoid them when working in the heat.  Follow-up: Return if symptoms worsen or fail to improve.  Claretta Fraise, M.D.

## 2018-07-07 LAB — CMP14+EGFR
ALBUMIN: 4.3 g/dL (ref 3.5–5.5)
ALK PHOS: 81 IU/L (ref 39–117)
ALT: 35 IU/L (ref 0–44)
AST: 30 IU/L (ref 0–40)
Albumin/Globulin Ratio: 1.5 (ref 1.2–2.2)
BILIRUBIN TOTAL: 0.5 mg/dL (ref 0.0–1.2)
BUN / CREAT RATIO: 14 (ref 9–20)
BUN: 9 mg/dL (ref 6–24)
CHLORIDE: 101 mmol/L (ref 96–106)
CO2: 20 mmol/L (ref 20–29)
CREATININE: 0.64 mg/dL — AB (ref 0.76–1.27)
Calcium: 9 mg/dL (ref 8.7–10.2)
GFR calc Af Amer: 142 mL/min/{1.73_m2} (ref >59)
GFR calc non Af Amer: 122 mL/min/{1.73_m2} (ref >59)
GLUCOSE: 92 mg/dL (ref 65–99)
Globulin, Total: 2.8 g/dL (ref 1.5–4.5)
Potassium: 3.7 mmol/L (ref 3.5–5.2)
Sodium: 138 mmol/L (ref 134–144)
Total Protein: 7.1 g/dL (ref 6.0–8.5)

## 2018-07-07 LAB — CBC WITH DIFFERENTIAL/PLATELET
BASOS: 0 %
Basophils Absolute: 0 10*3/uL (ref 0.0–0.2)
EOS (ABSOLUTE): 0.1 10*3/uL (ref 0.0–0.4)
Eos: 1 %
Hematocrit: 46.1 % (ref 37.5–51.0)
Hemoglobin: 15.3 g/dL (ref 13.0–17.7)
IMMATURE GRANS (ABS): 0 10*3/uL (ref 0.0–0.1)
IMMATURE GRANULOCYTES: 0 %
LYMPHS: 31 %
Lymphocytes Absolute: 2.7 10*3/uL (ref 0.7–3.1)
MCH: 30.8 pg (ref 26.6–33.0)
MCHC: 33.2 g/dL (ref 31.5–35.7)
MCV: 93 fL (ref 79–97)
MONOS ABS: 0.7 10*3/uL (ref 0.1–0.9)
Monocytes: 8 %
NEUTROS PCT: 60 %
Neutrophils Absolute: 5.2 10*3/uL (ref 1.4–7.0)
Platelets: 149 10*3/uL — ABNORMAL LOW (ref 150–450)
RBC: 4.97 x10E6/uL (ref 4.14–5.80)
RDW: 13.1 % (ref 12.3–15.4)
WBC: 8.7 10*3/uL (ref 3.4–10.8)

## 2018-07-07 LAB — PHOSPHORUS: PHOSPHORUS: 2.3 mg/dL — AB (ref 2.5–4.5)

## 2018-07-07 LAB — MAGNESIUM: MAGNESIUM: 1.9 mg/dL (ref 1.6–2.3)

## 2018-07-08 ENCOUNTER — Other Ambulatory Visit: Payer: Self-pay | Admitting: Family Medicine

## 2018-07-08 MED ORDER — AV-PHOS 250 NEUTRAL 155-852-130 MG PO TABS: 2.0000 | ORAL_TABLET | Freq: Three times a day (TID) | ORAL | 2 refills | Status: DC

## 2018-07-10 ENCOUNTER — Encounter: Payer: Self-pay | Admitting: Family Medicine

## 2018-07-10 ENCOUNTER — Ambulatory Visit (INDEPENDENT_AMBULATORY_CARE_PROVIDER_SITE_OTHER): Payer: Self-pay | Admitting: Family Medicine

## 2018-07-10 VITALS — BP 147/86 | HR 78 | Temp 97.3°F | Ht 66.0 in | Wt 195.8 lb

## 2018-07-10 DIAGNOSIS — F411 Generalized anxiety disorder: Secondary | ICD-10-CM

## 2018-07-10 MED ORDER — SERTRALINE HCL 50 MG PO TABS: 50.0000 mg | ORAL_TABLET | Freq: Every day | ORAL | 1 refills | Status: DC

## 2018-07-10 NOTE — Progress Notes (Signed)
BP (!) 147/86   Pulse 78   Temp (!) 97.3 F (36.3 C) (Oral)   Ht 5\' 6"  (1.676 m)   Wt 195 lb 12.8 oz (88.8 kg)   BMI 31.60 kg/m    Subjective:    Patient ID: Adam Sanchez, male    DOB: 1976-10-22, 42 y.o.   MRN: [id number]  HPI: Adam Sanchez is a 42 y.o. male presenting on 07/10/2018 for Anxiety   HPI Anxiety Patient comes in complaining of anxiety.  He says his anxiety has been more over the past year but he is pretty much had at most of his life.  He does admit that the increase has been related to his father's death was in [place] who was estranged from it has not seen in over 20 years because he has not been able to go back to [place].  He feels like his anxiety is built up to where he cannot control it any further and he is coming to seek treatment.  He is here with his significant other who says that she has been on sertraline and is worked great for her and that is what he would like to discuss is a possibility.  He denies any suicidal ideations or thoughts of hurting himself.  He does admit to some depression and feelings of guilt at times. GAD 7 score of 12 [facility] screen [facility] 2/9 07/06/2018 10/05/2017 03/22/2016  Decreased Interest 0 0 0  Down, Depressed, Hopeless 0 0 0  PHQ - 2 Score 0 0 0    Relevant past medical, surgical, family and social history reviewed and updated as indicated. Interim medical history since our last visit reviewed. Allergies and medications reviewed and updated.  Review of Systems  Constitutional: Negative for chills and fever.  Respiratory: Negative for shortness of breath and wheezing.   Cardiovascular: Negative for chest pain and leg swelling.  Musculoskeletal: Negative for back pain and gait problem.  Skin: Negative for rash.  Psychiatric/Behavioral: Positive for dysphoric mood. Negative for decreased concentration, self-injury, sleep disturbance and suicidal ideas. The patient is nervous/anxious.   All other systems reviewed and are  negative.   Per HPI unless specifically indicated above   Allergies as of 07/10/2018   No Known Allergies     Medication List        Accurate as of 07/10/18  6:38 PM. Always use your most recent med list.          AV-PHOS 250 NEUTRAL 155-852-130 MG Tabs Take 2 tablets by mouth 3 (three) times daily before meals.   predniSONE 10 MG tablet Commonly known as:  DELTASONE Take 5 daily for 2 days followed by 4,3,2 and 1 for 2 days each.          Objective:    BP (!) 147/86   Pulse 78   Temp (!) 97.3 F (36.3 C) (Oral)   Ht 5\' 6"  (1.676 m)   Wt 195 lb 12.8 oz (88.8 kg)   BMI 31.60 kg/m   Wt Readings from Last 3 Encounters:  07/10/18 195 lb 12.8 oz (88.8 kg)  07/06/18 195 lb 9.6 oz (88.7 kg)  01/06/18 190 lb (86.2 kg)    Physical Exam  Constitutional: He is oriented to person, place, and time. He appears well-developed and well-nourished. No distress.  Eyes: Conjunctivae are normal. No scleral icterus.  Neck: Neck supple. No thyromegaly present.  Cardiovascular: Normal rate, regular rhythm, normal heart sounds and intact distal pulses.  No murmur heard. Pulmonary/Chest:  Effort normal and breath sounds normal. No respiratory distress. He has no wheezes.  Musculoskeletal: Normal range of motion. He exhibits no edema.  Lymphadenopathy:    He has no cervical adenopathy.  Neurological: He is alert and oriented to person, place, and time. Coordination normal.  Skin: Skin is warm and dry. No rash noted. He is not diaphoretic.  Psychiatric: His behavior is normal. His mood appears anxious. He exhibits a depressed mood. He expresses no suicidal ideation. He expresses no suicidal plans.  Nursing note and vitals reviewed.       Assessment & Plan:   Problem List Items Addressed This Visit    None    Visit Diagnoses    Generalized anxiety disorder    -  Primary   Relevant Medications   sertraline (ZOLOFT) 50 MG tablet       Follow up plan: Return in about 1 month  (around 08/07/2018), or if symptoms worsen or fail to improve, for Anxiety recheck.  Counseling provided for all of the vaccine components No orders of the defined types were placed in this encounter.   Arville Care, MD [facility] 07/10/2018, 6:38 PM

## 2018-07-22 ENCOUNTER — Encounter: Payer: Self-pay | Admitting: Family Medicine

## 2018-07-23 ENCOUNTER — Encounter: Payer: Self-pay | Admitting: Family Medicine

## 2018-07-23 NOTE — Telephone Encounter (Signed)
Forward to PCP.

## 2018-08-07 ENCOUNTER — Ambulatory Visit: Payer: Self-pay | Admitting: Family Medicine

## 2018-08-21 ENCOUNTER — Encounter: Payer: Self-pay | Admitting: Family Medicine

## 2018-09-06 ENCOUNTER — Ambulatory Visit (INDEPENDENT_AMBULATORY_CARE_PROVIDER_SITE_OTHER): Payer: Self-pay | Admitting: Family Medicine

## 2018-09-06 ENCOUNTER — Encounter: Payer: Self-pay | Admitting: Family Medicine

## 2018-09-06 VITALS — BP 120/77 | HR 84 | Temp 97.0°F | Ht 66.0 in | Wt 190.2 lb

## 2018-09-06 DIAGNOSIS — F411 Generalized anxiety disorder: Secondary | ICD-10-CM

## 2018-09-06 DIAGNOSIS — M791 Myalgia, unspecified site: Secondary | ICD-10-CM

## 2018-09-06 MED ORDER — DOXYCYCLINE HYCLATE 100 MG PO TABS: 100.0000 mg | ORAL_TABLET | Freq: Two times a day (BID) | ORAL | 0 refills | Status: DC

## 2018-09-06 MED ORDER — SERTRALINE HCL 50 MG PO TABS: 50.0000 mg | ORAL_TABLET | Freq: Every day | ORAL | 1 refills | Status: DC

## 2018-09-06 NOTE — Progress Notes (Signed)
BP 120/77   Pulse 84   Temp (!) 97 F (36.1 C) (Oral)   Ht 5\' 6"  (1.676 m)   Wt 190 lb 3.2 oz (86.3 kg)   BMI 30.70 kg/m    Subjective:    Patient ID: Adam Sanchez Kinder, male    DOB: December 10, 1976, 42 y.o.   MRN: [id number]  HPI: Adam Sanchez Cumming is a 42 y.o. male presenting on 09/06/2018 for Arthritis (hurts all over, does not know about being bit by ticks, has been like this since his birthday he says) and Medication Refill (sertraline)   HPI Anxiety disorder Patient is coming in for anxiety recheck.  He has been on Zoloft and he feels like he is doing very well.  He asked about wanting to discontinue the Zoloft, is only been on it for 1 month.  He denies any suicidal ideations or thoughts of hurting himself.  He says this is really feeling a lot better.  He does feel like the Zoloft has slowed him down a little bit to his energy is not as much and that is why he wants to consider coming off of it.  [facility] 09/06/2018 07/06/2018 10/05/2017 03/22/2016  Decreased Interest 0 0 0 0  Down, Depressed, Hopeless 0 0 0 0  PHQ - 2 Score 0 0 0 0    Muscle and joint aches   Relevant past medical, surgical, family and social history reviewed and updated as indicated. Interim medical history since our last visit reviewed. Allergies and medications reviewed and updated.  Review of Systems  Constitutional: Negative for chills and fever.  Eyes: Negative for visual disturbance.  Respiratory: Negative for shortness of breath and wheezing.   Cardiovascular: Negative for chest pain and leg swelling.  Musculoskeletal: Positive for arthralgias and myalgias. Negative for back pain and gait problem.  Skin: Negative for rash.  Neurological: Negative for dizziness, weakness and light-headedness.  Psychiatric/Behavioral: Negative for decreased concentration, dysphoric mood, self-injury, sleep disturbance and suicidal ideas. The patient is nervous/anxious.   All other systems reviewed and are  negative.   Per HPI unless specifically indicated above   Allergies as of 09/06/2018   No Known Allergies     Medication List        Accurate as of 09/06/18  6:53 PM. Always use your most recent med list.          AV-PHOS 250 NEUTRAL [id number] MG Tabs Take 2 tablets by mouth 3 (three) times daily before meals.   doxycycline 100 MG tablet Commonly known as:  VIBRA-TABS Take 1 tablet (100 mg total) by mouth 2 (two) times daily. 1 po bid   sertraline 50 MG tablet Commonly known as:  ZOLOFT Take 1 tablet (50 mg total) by mouth daily.          Objective:    BP 120/77   Pulse 84   Temp (!) 97 F (36.1 C) (Oral)   Ht 5\' 6"  (1.676 m)   Wt 190 lb 3.2 oz (86.3 kg)   BMI 30.70 kg/m   Wt Readings from Last 3 Encounters:  09/06/18 190 lb 3.2 oz (86.3 kg)  07/10/18 195 lb 12.8 oz (88.8 kg)  07/06/18 195 lb 9.6 oz (88.7 kg)    Physical Exam  Constitutional: He is oriented to person, place, and time. He appears well-developed and well-nourished. No distress.  Eyes: Conjunctivae are normal. No scleral icterus.  Neck: Neck supple. No thyromegaly present.  Cardiovascular: Normal rate, regular rhythm, normal heart  sounds and intact distal pulses.  No murmur heard. Pulmonary/Chest: Effort normal and breath sounds normal. No respiratory distress. He has no wheezes.  Musculoskeletal: Normal range of motion. He exhibits no edema or tenderness (No tenderness on exam).  Lymphadenopathy:    He has no cervical adenopathy.  Neurological: He is alert and oriented to person, place, and time. Coordination normal.  Skin: Skin is warm and dry. No rash noted. He is not diaphoretic.  Psychiatric: His behavior is normal. His mood appears anxious. He does not exhibit a depressed mood. He expresses no suicidal ideation. He expresses no suicidal plans.  Nursing note and vitals reviewed.       Assessment & Plan:   Problem List Items Addressed This Visit    None    Visit Diagnoses     Generalized anxiety disorder    -  Primary   Relevant Medications   sertraline (ZOLOFT) 50 MG tablet   Myalgia       Relevant Medications   doxycycline (VIBRA-TABS) 100 MG tablet    Patient wanted to stop Zoloft, recommended that he take it for at least 2 more months because he just started it and then he can go ahead and wean himself down off of it.  The myalgias may have the appearance of Lyme disease, patient is self-pay so I cannot do a lot of testing today, will send doxycycline for him for possible treatment and then if it continues then I will recommend for him to do lab testing for myalgias.  Follow up plan: Return in about 3 months (around 12/06/2018), or if symptoms worsen or fail to improve.  Counseling provided for all of the vaccine components No orders of the defined types were placed in this encounter.   Arville Care, MD [facility] 09/06/2018, 6:53 PM

## 2018-09-23 ENCOUNTER — Telehealth: Payer: Self-pay | Admitting: Family Medicine

## 2018-09-23 NOTE — Telephone Encounter (Signed)
PT wife states that pt has seen dr [employer] officer and suspecting Lyme disease, been having no appetite and fever off and on, alternating tylenol and indoprofen to keep fever down. Wife is worried about husband please call her at work [phone] ext [phone]

## 2018-09-23 NOTE — Telephone Encounter (Signed)
appt scheduled for evaluation Pt notified  

## 2018-09-24 ENCOUNTER — Ambulatory Visit (INDEPENDENT_AMBULATORY_CARE_PROVIDER_SITE_OTHER): Payer: Self-pay | Admitting: Nurse Practitioner

## 2018-09-24 ENCOUNTER — Encounter: Payer: Self-pay | Admitting: Nurse Practitioner

## 2018-09-24 VITALS — BP 127/76 | HR 95 | Temp 101.4°F | Ht 66.0 in | Wt 191.0 lb

## 2018-09-24 DIAGNOSIS — R509 Fever, unspecified: Secondary | ICD-10-CM

## 2018-09-24 DIAGNOSIS — M791 Myalgia, unspecified site: Secondary | ICD-10-CM

## 2018-09-24 LAB — CBC WITH DIFFERENTIAL/PLATELET
Basophils Absolute: 0 10*3/uL (ref 0.0–0.2)
Basos: 0 %
EOS (ABSOLUTE): 0 10*3/uL (ref 0.0–0.4)
EOS: 0 %
HEMOGLOBIN: 13.8 g/dL (ref 13.0–17.7)
Hematocrit: 39.8 % (ref 37.5–51.0)
IMMATURE GRANS (ABS): 0 10*3/uL (ref 0.0–0.1)
IMMATURE GRANULOCYTES: 0 %
LYMPHS: 17 %
Lymphocytes Absolute: 0.9 10*3/uL (ref 0.7–3.1)
MCH: 30.6 pg (ref 26.6–33.0)
MCHC: 34.7 g/dL (ref 31.5–35.7)
MCV: 88 fL (ref 79–97)
Monocytes Absolute: 0.3 10*3/uL (ref 0.1–0.9)
Monocytes: 5 %
NEUTROS ABS: 4 10*3/uL (ref 1.4–7.0)
Neutrophils: 78 %
Platelets: 122 10*3/uL — ABNORMAL LOW (ref 150–450)
RBC: 4.51 x10E6/uL (ref 4.14–5.80)
RDW: 13.3 % (ref 12.3–15.4)
WBC: 5.1 10*3/uL (ref 3.4–10.8)

## 2018-09-24 MED ORDER — DOXYCYCLINE HYCLATE 100 MG PO TABS: 100.0000 mg | ORAL_TABLET | Freq: Two times a day (BID) | ORAL | 0 refills | Status: DC

## 2018-09-24 NOTE — Progress Notes (Signed)
   Subjective:    Patient ID: Adam Sanchez, male    DOB: January 09, 1976, 42 y.o.   MRN: [id number]   Chief Complaint: Fever (4 days  Currently being treated for Lymes disease)   HPI Patient come in today c/o fever for 4 days. They have not checked his temperature but he feels hat breaks out in a sweat then it comes back. He has been taking ibuprofen and tylenol. He has been on doxycycline for 13 days for possible lyme disease. He was n ot tested for lymes disease due to expense.    Review of Systems  Constitutional: Positive for chills and fever. Negative for activity change and appetite change.  HENT: Negative.   Eyes: Negative for pain.  Respiratory: Negative for shortness of breath.   Cardiovascular: Negative for chest pain, palpitations and leg swelling.  Gastrointestinal: Negative for abdominal pain.  Endocrine: Negative for polydipsia.  Genitourinary: Negative.   Musculoskeletal: Positive for myalgias.  Skin: Negative for rash.  Neurological: Negative for dizziness, weakness and headaches.  Hematological: Does not bruise/bleed easily.  Psychiatric/Behavioral: Negative.   All other systems reviewed and are negative.      Objective:   Physical Exam  Constitutional: He appears well-developed and well-nourished.  Cardiovascular: Normal rate and regular rhythm.  Pulmonary/Chest: Effort normal.  Abdominal: Soft. Bowel sounds are normal.  Neurological: He is alert.  Skin: Skin is warm. He is diaphoretic.  Nursing note and vitals reviewed.  BP 127/76   Pulse 95   Temp (!) 101.4 F (38.6 C) (Oral)   Ht 5\' 6"  (1.676 m)   Wt 191 lb (86.6 kg)   BMI 30.83 kg/m         Assessment & Plan:  Debbora Lacrosse in today with chief complaint of Fever (4 days  Currently being treated for Lymes disease)   1. Myalgia Rest  Force fluids RTO prn - doxycycline (VIBRA-TABS) 100 MG tablet; Take 1 tablet (100 mg total) by mouth 2 (two) times daily. 1 po bid  Dispense: 28 tablet; Refill:  0  2. Fever, unspecified fever cause Get thermonmeter and check degree of fever. Going to extend doxycycline for 2 more weeks for full coverage of lyme disease. - doxycycline (VIBRA-TABS) 100 MG tablet; Take 1 tablet (100 mg total) by mouth 2 (two) times daily. 1 po bid  Dispense: 28 tablet; Refill: 0 - CBC with Differential/Platelet  Mary-Margaret Daphine Deutscher, FNP

## 2018-09-24 NOTE — Patient Instructions (Signed)
Fever, Adult A fever is an increase in the body's temperature. It is often defined as a temperature of 100 F (38C) or higher. Short mild or moderate fevers often have no long-term effects. They also often do not need treatment. Moderate or high fevers may make you feel uncomfortable. Sometimes, they can also be a sign of a serious illness or disease. The sweating that may happen with repeated fevers or fevers that last a while may also cause you to not have enough fluid in your body (dehydration). You can take your temperature with a thermometer to see if you have a fever. A measured temperature can change with:  Age.  Time of day.  Where the thermometer is placed: ? Mouth (oral). ? Rectum (rectal). ? Ear (tympanic). ? Underarm (axillary). ? Forehead (temporal).  Follow these instructions at home: Pay attention to any changes in your symptoms. Take these actions to help with your condition:  Take over-the-counter and prescription medicines only as told by your doctor. Follow the dosing instructions carefully.  If you were prescribed an antibiotic medicine, take it as told by your doctor. Do not stop taking the antibiotic even if you start to feel better.  Rest as needed.  Drink enough fluid to keep your pee (urine) clear or pale yellow.  Sponge yourself or bathe with room-temperature water as needed. This helps to lower your body temperature . Do not use ice water.  Do not wear too many blankets or heavy clothes.  Contact a doctor if:  You throw up (vomit).  You cannot eat or drink without throwing up.  You have watery poop (diarrhea).  It hurts when you pee.  Your symptoms do not get better with treatment.  You have new symptoms.  You feel very weak. Get help right away if:  You are short of breath or have trouble breathing.  You are dizzy or you pass out (faint).  You feel confused.  You have signs of not having enough fluid in your body, such as: ? A dry  mouth. ? Peeing less. ? Looking pale.  You have very bad pain in your belly (abdomen).  You keep throwing up or having water poop.  You have a skin rash.  Your symptoms suddenly get worse. This information is not intended to replace advice given to you by your health care provider. Make sure you discuss any questions you have with your health care provider. Document Released: 09/19/2008 Document Revised: 05/18/2016 Document Reviewed: 02/04/2015 Elsevier Interactive Patient Education  2018 [employer]  

## 2018-10-16 ENCOUNTER — Encounter: Payer: Self-pay | Admitting: Family Medicine

## 2018-10-16 MED ORDER — CYCLOBENZAPRINE HCL 10 MG PO TABS: 10.0000 mg | ORAL_TABLET | Freq: Three times a day (TID) | ORAL | 0 refills | Status: DC | PRN

## 2018-10-16 NOTE — Telephone Encounter (Signed)
Please tell the patient I have sent a muscle relaxer for him to help with the myalgias and see if he gets improvement from this

## 2018-12-10 ENCOUNTER — Other Ambulatory Visit: Payer: Self-pay | Admitting: Family Medicine

## 2018-12-10 DIAGNOSIS — F411 Generalized anxiety disorder: Secondary | ICD-10-CM

## 2018-12-10 MED ORDER — CYCLOBENZAPRINE HCL 10 MG PO TABS: 10.0000 mg | ORAL_TABLET | Freq: Three times a day (TID) | ORAL | 0 refills | Status: DC | PRN

## 2019-04-04 ENCOUNTER — Encounter: Payer: Self-pay | Admitting: Family Medicine

## 2019-04-11 ENCOUNTER — Ambulatory Visit: Payer: Self-pay | Admitting: Family Medicine

## 2019-04-11 ENCOUNTER — Other Ambulatory Visit: Payer: Self-pay

## 2019-04-11 NOTE — Progress Notes (Signed)
Unable to contact the patient via phone after 3 attempts Arville Care, MD [facility] 04/11/2019, 12:35 PM

## 2019-07-15 ENCOUNTER — Other Ambulatory Visit: Payer: Self-pay

## 2019-07-15 ENCOUNTER — Emergency Department (HOSPITAL_COMMUNITY): Payer: Self-pay

## 2019-07-15 ENCOUNTER — Encounter (HOSPITAL_COMMUNITY): Payer: Self-pay | Admitting: *Deleted

## 2019-07-15 ENCOUNTER — Inpatient Hospital Stay (HOSPITAL_COMMUNITY)
Admission: EM | Admit: 2019-07-15 | Discharge: 2019-07-26 | DRG: 871 | Disposition: E | Payer: Self-pay | Attending: Pulmonary Disease | Admitting: Pulmonary Disease

## 2019-07-15 DIAGNOSIS — E876 Hypokalemia: Secondary | ICD-10-CM | POA: Diagnosis present

## 2019-07-15 DIAGNOSIS — K529 Noninfective gastroenteritis and colitis, unspecified: Secondary | ICD-10-CM | POA: Diagnosis present

## 2019-07-15 DIAGNOSIS — Z20828 Contact with and (suspected) exposure to other viral communicable diseases: Secondary | ICD-10-CM | POA: Diagnosis present

## 2019-07-15 DIAGNOSIS — K7030 Alcoholic cirrhosis of liver without ascites: Secondary | ICD-10-CM | POA: Diagnosis present

## 2019-07-15 DIAGNOSIS — Z978 Presence of other specified devices: Secondary | ICD-10-CM

## 2019-07-15 DIAGNOSIS — F10239 Alcohol dependence with withdrawal, unspecified: Secondary | ICD-10-CM | POA: Diagnosis present

## 2019-07-15 DIAGNOSIS — R578 Other shock: Secondary | ICD-10-CM | POA: Diagnosis present

## 2019-07-15 DIAGNOSIS — Y900 Blood alcohol level of less than 20 mg/100 ml: Secondary | ICD-10-CM | POA: Diagnosis present

## 2019-07-15 DIAGNOSIS — K449 Diaphragmatic hernia without obstruction or gangrene: Secondary | ICD-10-CM | POA: Diagnosis present

## 2019-07-15 DIAGNOSIS — E860 Dehydration: Secondary | ICD-10-CM | POA: Diagnosis present

## 2019-07-15 DIAGNOSIS — K760 Fatty (change of) liver, not elsewhere classified: Secondary | ICD-10-CM | POA: Diagnosis present

## 2019-07-15 DIAGNOSIS — I272 Pulmonary hypertension, unspecified: Secondary | ICD-10-CM | POA: Diagnosis present

## 2019-07-15 DIAGNOSIS — R569 Unspecified convulsions: Secondary | ICD-10-CM | POA: Diagnosis present

## 2019-07-15 DIAGNOSIS — J9601 Acute respiratory failure with hypoxia: Secondary | ICD-10-CM | POA: Diagnosis present

## 2019-07-15 DIAGNOSIS — J690 Pneumonitis due to inhalation of food and vomit: Secondary | ICD-10-CM | POA: Diagnosis present

## 2019-07-15 DIAGNOSIS — R6521 Severe sepsis with septic shock: Secondary | ICD-10-CM | POA: Diagnosis present

## 2019-07-15 DIAGNOSIS — I462 Cardiac arrest due to underlying cardiac condition: Secondary | ICD-10-CM | POA: Diagnosis not present

## 2019-07-15 DIAGNOSIS — K920 Hematemesis: Secondary | ICD-10-CM | POA: Diagnosis present

## 2019-07-15 DIAGNOSIS — K7460 Unspecified cirrhosis of liver: Secondary | ICD-10-CM | POA: Diagnosis present

## 2019-07-15 DIAGNOSIS — K2970 Gastritis, unspecified, without bleeding: Secondary | ICD-10-CM | POA: Diagnosis present

## 2019-07-15 DIAGNOSIS — D6959 Other secondary thrombocytopenia: Secondary | ICD-10-CM | POA: Diagnosis present

## 2019-07-15 DIAGNOSIS — A419 Sepsis, unspecified organism: Principal | ICD-10-CM | POA: Diagnosis present

## 2019-07-15 DIAGNOSIS — Z79899 Other long term (current) drug therapy: Secondary | ICD-10-CM

## 2019-07-15 DIAGNOSIS — E871 Hypo-osmolality and hyponatremia: Secondary | ICD-10-CM | POA: Diagnosis present

## 2019-07-15 DIAGNOSIS — J189 Pneumonia, unspecified organism: Secondary | ICD-10-CM | POA: Diagnosis present

## 2019-07-15 DIAGNOSIS — F17210 Nicotine dependence, cigarettes, uncomplicated: Secondary | ICD-10-CM | POA: Diagnosis present

## 2019-07-15 DIAGNOSIS — F1010 Alcohol abuse, uncomplicated: Secondary | ICD-10-CM | POA: Diagnosis present

## 2019-07-15 LAB — CBC WITH DIFFERENTIAL/PLATELET
Abs Immature Granulocytes: 0.15 10*3/uL — ABNORMAL HIGH (ref 0.00–0.07)
Basophils Absolute: 0 10*3/uL (ref 0.0–0.1)
Basophils Relative: 0 %
Eosinophils Absolute: 0 10*3/uL (ref 0.0–0.5)
Eosinophils Relative: 0 %
HCT: 33.2 % — ABNORMAL LOW (ref 39.0–52.0)
Hemoglobin: 11.3 g/dL — ABNORMAL LOW (ref 13.0–17.0)
Immature Granulocytes: 1 %
Lymphocytes Relative: 6 %
Lymphs Abs: 1 10*3/uL (ref 0.7–4.0)
MCH: 33.6 pg (ref 26.0–34.0)
MCHC: 34 g/dL (ref 30.0–36.0)
MCV: 98.8 fL (ref 80.0–100.0)
Monocytes Absolute: 1 10*3/uL (ref 0.1–1.0)
Monocytes Relative: 6 %
Neutro Abs: 14.2 10*3/uL — ABNORMAL HIGH (ref 1.7–7.7)
Neutrophils Relative %: 87 %
Platelets: 99 10*3/uL — ABNORMAL LOW (ref 150–400)
RBC: 3.36 MIL/uL — ABNORMAL LOW (ref 4.22–5.81)
RDW: 17.2 % — ABNORMAL HIGH (ref 11.5–15.5)
WBC: 16.3 10*3/uL — ABNORMAL HIGH (ref 4.0–10.5)
nRBC: 0.2 % (ref 0.0–0.2)

## 2019-07-15 LAB — HEPATIC FUNCTION PANEL
ALT: 103 U/L — ABNORMAL HIGH (ref 0–44)
AST: 472 U/L — ABNORMAL HIGH (ref 15–41)
Albumin: 1.7 g/dL — ABNORMAL LOW (ref 3.5–5.0)
Alkaline Phosphatase: 529 U/L — ABNORMAL HIGH (ref 38–126)
Bilirubin, Direct: 2.5 mg/dL — ABNORMAL HIGH (ref 0.0–0.2)
Indirect Bilirubin: 1.8 mg/dL — ABNORMAL HIGH (ref 0.3–0.9)
Total Bilirubin: 4.3 mg/dL — ABNORMAL HIGH (ref 0.3–1.2)
Total Protein: 8.9 g/dL — ABNORMAL HIGH (ref 6.5–8.1)

## 2019-07-15 LAB — BRAIN NATRIURETIC PEPTIDE: B Natriuretic Peptide: 548 pg/mL — ABNORMAL HIGH (ref 0.0–100.0)

## 2019-07-15 LAB — TROPONIN I (HIGH SENSITIVITY)
Troponin I (High Sensitivity): 1692 ng/L (ref <18)
Troponin I (High Sensitivity): 1373 ng/L (ref <18)

## 2019-07-15 LAB — RAPID URINE DRUG SCREEN, HOSP PERFORMED
Amphetamines: NOT DETECTED
Barbiturates: NOT DETECTED
Benzodiazepines: NOT DETECTED
Cocaine: NOT DETECTED
Opiates: NOT DETECTED
Tetrahydrocannabinol: NOT DETECTED

## 2019-07-15 LAB — URINALYSIS, ROUTINE W REFLEX MICROSCOPIC
Bacteria, UA: NONE SEEN
Glucose, UA: NEGATIVE mg/dL
Ketones, ur: NEGATIVE mg/dL
Leukocytes,Ua: NEGATIVE
Nitrite: NEGATIVE
Protein, ur: 100 mg/dL — AB
Specific Gravity, Urine: >1.046 — ABNORMAL HIGH (ref 1.005–1.030)
pH: 7 (ref 5.0–8.0)

## 2019-07-15 LAB — FERRITIN: Ferritin: 1220 ng/mL — ABNORMAL HIGH (ref 24–336)

## 2019-07-15 LAB — BASIC METABOLIC PANEL
Anion gap: 13 (ref 5–15)
BUN: 5 mg/dL — ABNORMAL LOW (ref 6–20)
CO2: 17 mmol/L — ABNORMAL LOW (ref 22–32)
Calcium: 7.5 mg/dL — ABNORMAL LOW (ref 8.9–10.3)
Chloride: 97 mmol/L — ABNORMAL LOW (ref 98–111)
Creatinine, Ser: 0.55 mg/dL — ABNORMAL LOW (ref 0.61–1.24)
GFR calc Af Amer: >60 mL/min (ref >60)
GFR calc non Af Amer: >60 mL/min (ref >60)
Glucose, Bld: 112 mg/dL — ABNORMAL HIGH (ref 70–99)
Potassium: 3.4 mmol/L — ABNORMAL LOW (ref 3.5–5.1)
Sodium: 127 mmol/L — ABNORMAL LOW (ref 135–145)

## 2019-07-15 LAB — SARS CORONAVIRUS 2 BY RT PCR (HOSPITAL ORDER, PERFORMED IN ~~LOC~~ HOSPITAL LAB): SARS Coronavirus 2: NEGATIVE

## 2019-07-15 LAB — PROCALCITONIN: Procalcitonin: 1.03 ng/mL

## 2019-07-15 LAB — LACTATE DEHYDROGENASE: LDH: 528 U/L — ABNORMAL HIGH (ref 98–192)

## 2019-07-15 LAB — ETHANOL: Alcohol, Ethyl (B): <10 mg/dL (ref <10)

## 2019-07-15 LAB — D-DIMER, QUANTITATIVE: D-Dimer, Quant: >20 ug/mL-FEU — ABNORMAL HIGH (ref 0.00–0.50)

## 2019-07-15 LAB — ABO/RH: ABO/RH(D): A POS

## 2019-07-15 LAB — LACTIC ACID, PLASMA
Lactic Acid, Venous: 2.7 mmol/L (ref 0.5–1.9)
Lactic Acid, Venous: 2.4 mmol/L (ref 0.5–1.9)
Lactic Acid, Venous: 2.1 mmol/L (ref 0.5–1.9)

## 2019-07-15 LAB — MAGNESIUM: Magnesium: 1.6 mg/dL — ABNORMAL LOW (ref 1.7–2.4)

## 2019-07-15 LAB — C-REACTIVE PROTEIN: CRP: 10.2 mg/dL — ABNORMAL HIGH (ref <1.0)

## 2019-07-15 LAB — PHOSPHORUS: Phosphorus: 3 mg/dL (ref 2.5–4.6)

## 2019-07-15 MED ORDER — LORAZEPAM 2 MG/ML IJ SOLN: 0.0000 mg | Freq: Four times a day (QID) | INTRAMUSCULAR | Status: DC

## 2019-07-15 MED ORDER — ACETAMINOPHEN 325 MG PO TABS: 650.0000 mg | ORAL_TABLET | Freq: Once | ORAL | Status: DC

## 2019-07-15 MED ORDER — MAGNESIUM SULFATE 2 GM/50ML IV SOLN
2.0000 g | Freq: Once | INTRAVENOUS | Status: AC
  Administered 2019-07-15: 2 g via INTRAVENOUS
  Filled 2019-07-15: qty 50

## 2019-07-15 MED ORDER — POTASSIUM CHLORIDE CRYS ER 20 MEQ PO TBCR
40.0000 meq | EXTENDED_RELEASE_TABLET | Freq: Once | ORAL | Status: AC
  Administered 2019-07-15: 40 meq via ORAL
  Filled 2019-07-15: qty 2

## 2019-07-15 MED ORDER — SODIUM CHLORIDE 0.9 % IV BOLUS
1000.0000 mL | Freq: Once | INTRAVENOUS | Status: AC
  Administered 2019-07-15: 1000 mL via INTRAVENOUS

## 2019-07-15 MED ORDER — IBUPROFEN 400 MG PO TABS
400.0000 mg | ORAL_TABLET | Freq: Once | ORAL | Status: AC
  Administered 2019-07-15: 14:00:00 400 mg via ORAL
  Filled 2019-07-15: qty 1

## 2019-07-15 MED ORDER — LEVETIRACETAM IN NACL 1000 MG/100ML IV SOLN
1000.0000 mg | Freq: Once | INTRAVENOUS | Status: AC
  Administered 2019-07-15: 1000 mg via INTRAVENOUS
  Filled 2019-07-15: qty 100

## 2019-07-15 MED ORDER — LEVETIRACETAM IN NACL 500 MG/100ML IV SOLN
500.0000 mg | Freq: Two times a day (BID) | INTRAVENOUS | Status: DC
  Filled 2019-07-15 (×3): qty 100

## 2019-07-15 MED ORDER — LORAZEPAM 1 MG PO TABS
0.0000 mg | ORAL_TABLET | Freq: Four times a day (QID) | ORAL | Status: DC
  Administered 2019-07-15: 1 mg via ORAL
  Filled 2019-07-15: qty 1

## 2019-07-15 MED ORDER — AZITHROMYCIN 250 MG PO TABS
500.0000 mg | ORAL_TABLET | Freq: Once | ORAL | Status: AC
  Administered 2019-07-15: 500 mg via ORAL
  Filled 2019-07-15: qty 2

## 2019-07-15 MED ORDER — LORAZEPAM 2 MG/ML IJ SOLN: 0.0000 mg | Freq: Two times a day (BID) | INTRAMUSCULAR | Status: DC

## 2019-07-15 MED ORDER — LORAZEPAM 1 MG PO TABS: 0.0000 mg | ORAL_TABLET | Freq: Two times a day (BID) | ORAL | Status: DC

## 2019-07-15 MED ORDER — SODIUM CHLORIDE 0.9 % IV SOLN
1.0000 g | Freq: Once | INTRAVENOUS | Status: AC
  Administered 2019-07-15: 1 g via INTRAVENOUS
  Filled 2019-07-15: qty 10

## 2019-07-15 MED ORDER — IOHEXOL 300 MG/ML  SOLN
100.0000 mL | Freq: Once | INTRAMUSCULAR | Status: AC | PRN
  Administered 2019-07-15: 100 mL via INTRAVENOUS

## 2019-07-15 MED ORDER — VITAMIN B-1 100 MG PO TABS
100.0000 mg | ORAL_TABLET | Freq: Every day | ORAL | Status: DC
  Administered 2019-07-15: 100 mg via ORAL
  Filled 2019-07-15: qty 1

## 2019-07-15 MED ORDER — SODIUM CHLORIDE 0.9 % IV BOLUS: 500.0000 mL | Freq: Once | INTRAVENOUS | Status: DC

## 2019-07-15 MED ORDER — THIAMINE HCL 100 MG/ML IJ SOLN: 100.0000 mg | Freq: Every day | INTRAMUSCULAR | Status: DC

## 2019-07-15 NOTE — ED Triage Notes (Signed)
Pt with sob since yesterday and feels heart is racing.

## 2019-07-15 NOTE — ED Notes (Signed)
Pt place on 2L [place]

## 2019-07-15 NOTE — ED Notes (Signed)
Date and time results received: 07-23-2019 9:33 PM  (use smartphrase ".now" to insert current time)  Test:  Lactic  Critical Value: 2.1 Expected down trend

## 2019-07-15 NOTE — ED Notes (Signed)
Date and time results received: Aug 07, 2019 12:47 PM  (use smartphrase ".now" to insert current time)  Test: Troponin High sens Critical Value: 1692  Name of Provider Notified: Alvino Chapel  Orders Received? Or Actions Taken?: Orders Received - See Orders for details

## 2019-07-15 NOTE — ED Notes (Signed)
Date and time results received: 06/29/2019 4:18 PM  (use smartphrase ".now" to insert current time)  Test: lactic acid Critical Value: 2.4  Name of Provider Notified: [place]  Orders Received? Or Actions Taken?: Orders Received - See Orders for details

## 2019-07-15 NOTE — ED Notes (Signed)
Date and time results received: 07-20-2019 9:04 PM   (use smartphrase ".now" to insert current time)  Test: trop  Critical Value: 1,373  Name of Provider Notified:  Arlyce Dice   Orders Received? Or Actions Taken?: .

## 2019-07-15 NOTE — H&P (Addendum)
History and Physical    Adam LacrosseJose Sanchez QIO:[id number] DOB: 10-17-1976 DOA: 07/07/2019  PCP: Dettinger, Elige RadonJoshua A, Sanchez   Patient coming from: Home  I have personally briefly reviewed patient's old medical records [facility]  Chief Complaint: Shortness of breath, chest pain, seizures  HPI: Adam Sanchez is a 43 y.o. male with medical history significant for alcoholism, liver cirrhosis esophageal varices, seizures. Patient is Spanish-speaking but he is able to speak understand and speak limited [place].  Patient presented to the ED with complaints of multiple symptoms that started between last night and this morning.  Patient reports difficulty breathing, heart racing, cough, with body aches, lower abdominal pain with nausea, dry heaves and loose stools.  He also reports chest pain also that started last night, nonradiating.  He denies prior chest pain.  Chest pain is intermittent.  Patient also reports that he had a seizure today.  His last seizure otherwise was 1 year ago.  He tells me he is not on seizure medications, he is not sure why.  He is unable to tell me exactly if his seizures are related to his alcohol abuse. Patient works in [employer].  He denies sick contacts.    Patient's wife-is American, she tells me she had COVID test done yesterday in preparation for an ob procedure-I&D.  She reports patient quit drinking alcohol in 2014 but picked back up in the past 3 months.  After they lost their twin babies inutero, in April.  She reports patient has had seizures his whole life, she has never seen him have a seizure.  But she knows when he has had 1 because she sees the bite marks on his tongue.  She did not witness patient seizure today.  Patient denies family history of heart attacks.  Patient drinks alcohol daily, last drink was yesterday.  On the average he drinks six pack of beers daily.  He smokes half a pack to a pack of cigarettes daily.  ED Course: T-max 102.3, initial tachycardia  146, tachypneic to 41, blood pressure systolic 92-1 34.  O2 sats 90% on room air, improved with nasal cannula.  WBC 16.3.  With lactic acidosis 2.7 >> 2.4.  High-sensitivity troponin elevated 1692.  Elevated BNP 548.  Sodium 127.  Potassium 3.4.  Mildly low platelets 99.  With elevated liver enzymes ALP 529, AST 472, ALT 103, total bilirubin 4.3, with low albumin 1.7.   Portable chest x-ray-patchy bilateral airspace opacities, mid and lower lobe predominant compatible with multifocal pneumonia versus pulmonary edema.   CT abdomen and pelvis -extensive focal and confluent patchy opacities at both lung bases compatible with infectious process.  Differentials multilobal pneumonia, septic emboli and marked changes due to viral pneumonitis.  Bowel abnormalities compatible with extensive cases of gastroenteritis, diffuse hepatic steatosis and changes of liver cirrhosis.   Patient started on community-acquired pneumonia coverage with IV ceftriaxone and azithromycin.  2 L bolus normal saline given.  Hospitalist to admit for multifocal pneumonia.  Review of Systems: As per HPI all other systems reviewed and negative.  Past Medical History:  Diagnosis Date   Alcoholism (HCC)    Esophageal varices (HCC)    Seizures (HCC)     Past Surgical History:  Procedure Laterality Date   ESOPHAGOGASTRODUODENOSCOPY N/A 10/12/2013   Procedure: ESOPHAGOGASTRODUODENOSCOPY (EGD);  Surgeon: Shirley FriarVincent C. Schooler, Sanchez;  [facility];  Service: Endoscopy;  Laterality: N/A;     reports that he has been smoking cigarettes. He has a 5.00 pack-year smoking  history. He has never used smokeless tobacco. He reports current alcohol use. He reports that he does not use drugs.  No Known Allergies  No family history of heart disease.  Prior to Admission medications   Medication Sig Start Date End Date Taking? Authorizing Provider  acetaminophen (TYLENOL) 500 MG tablet Take 500 mg by mouth every 6 (six) hours as needed  for mild pain.   Yes Provider, Historical, Sanchez  diphenhydrAMINE (BENADRYL) 25 MG tablet Take 25 mg by mouth every 6 (six) hours as needed for allergies.   Yes Provider, Historical, Sanchez  Pseudoeph-Doxylamine-DM-APAP (NYQUIL PO) Take 15-30 mLs by mouth daily as needed (for cold symptoms).   Yes Provider, Historical, Sanchez    Physical Exam: Vitals:   2019/07/25 1700 July 25, 2019 1704 2019-07-25 1715 Jul 25, 2019 1724  BP: 92/62   106/66  Pulse: (!) 124  (!) 127 (!) 128  Resp:   (!) 26   Temp:  98.7 F (37.1 C)    TempSrc:  Oral    SpO2:        Constitutional: NAD, calm, comfortable Vitals:   07/25/2019 1700 2019/07/25 1704 2019-07-25 1715 25-Jul-2019 1724  BP: 92/62   106/66  Pulse: (!) 124  (!) 127 (!) 128  Resp:   (!) 26   Temp:  98.7 F (37.1 C)    TempSrc:  Oral    SpO2:       Eyes: PERRL, lids normal, sclera icterus ENMT: Mucous membranes are moist. Posterior pharynx clear of any exudate or lesions..  Neck: normal, supple, no masses, no thyromegaly Respiratory: Tachypneic, normal respiratory effort. No accessory muscle use.  Cardiovascular: Tachycardic, regular rate and rhythm, no murmurs / rubs / gallops. No extremity edema. 2+ pedal pulses. No carotid bruits.  Abdomen: no tenderness, no masses palpated. No hepatosplenomegaly. Bowel sounds positive.  Musculoskeletal: no clubbing / cyanosis. No joint deformity upper and lower extremities. Good ROM, no contractures. Normal muscle tone.  Skin: no rashes, lesions, ulcers. No induration Neurologic: CN 2-12 grossly intact.  Strength 5/5 in all 4.  Psychiatric: Normal judgment and insight. Alert and oriented x 3. Normal mood.   Labs on Admission: I have personally reviewed following labs and imaging studies  CBC: Recent Labs  Lab 07-25-19 1129  WBC 16.3*  NEUTROABS 14.2*  HGB 11.3*  HCT 33.2*  MCV 98.8  PLT 99*   Basic Metabolic Panel: Recent Labs  Lab 07-25-2019 1129  NA 127*  K 3.4*  CL 97*  CO2 17*  GLUCOSE 112*  BUN 5*  CREATININE  0.55*  CALCIUM 7.5*   Liver Function Tests: Recent Labs  Lab 2019/07/25 1129  AST 472*  ALT 103*  ALKPHOS 529*  BILITOT 4.3*  PROT 8.9*  ALBUMIN 1.7*   Anemia Panel: Recent Labs    Jul 25, 2019 1129  FERRITIN 1,220*    Radiological Exams on Admission: Ct Head Wo Contrast  Result Date: 2019/07/25 CLINICAL DATA:  Seizure this morning. EXAM: CT HEAD WITHOUT CONTRAST TECHNIQUE: Contiguous axial images were obtained from the base of the skull through the vertex without intravenous contrast. COMPARISON:  None. FINDINGS: Brain: Small cortical calcification in the left frontoparietal region and small cortical calcification in the right parieto-occipital region. Otherwise, normal appearing cerebral hemispheres and posterior fossa structures. Normal size and position of the ventricles. No intracranial hemorrhage, mass lesion or CT evidence of acute infarction. Vascular: No hyperdense vessel or unexpected calcification. Skull: Normal. Negative for fracture or focal lesion. Sinuses/Orbits: Unremarkable. Other: None. IMPRESSION: 1. No acute abnormality.  2. Two small brain cortical calcifications, as described above. These have benign features, possibly related to previous infection such as cysticercosis or previous granulomatous infection. Electronically Signed   By: Beckie SaltsSteven  Reid M.D.   On: 07/25/2019 16:10   Ct Abdomen Pelvis W Contrast  Result Date: 07/19/2019 CLINICAL DATA:  Acute, generalized abdominal pain, primarily in the lower abdomen with nausea, vomiting and diarrhea since last night. EXAM: CT ABDOMEN AND PELVIS WITH CONTRAST TECHNIQUE: Multidetector CT imaging of the abdomen and pelvis was performed using the standard protocol following bolus administration of intravenous contrast. CONTRAST:  100mL OMNIPAQUE IOHEXOL 300 MG/ML  SOLN COMPARISON:  None. FINDINGS: Lower chest: Large number of small, focal and confluent patchy opacities at both lung bases, involving both lower lobes, right middle lobe  and lingula. No pleural fluid. Hepatobiliary: Diffuse low density of the liver relative to the spleen. Enlarged lateral segment left lobe and mildly enlarged caudate lobe and mildly irregular liver contours. Pancreas: Unremarkable. No pancreatic ductal dilatation or surrounding inflammatory changes. Spleen: Enlarged, measuring 16.2 cm in length. Adrenals/Urinary Tract: Adrenal glands are unremarkable. Kidneys are normal, without renal calculi, focal lesion, or hydronephrosis. Bladder is unremarkable. Stomach/Bowel: Small to moderate-sized hiatal hernia. Moderate diffuse, low density proximal gastric wall thickening. Mild diffuse small bowel wall thickening and enhancement. There is also some diffuse low density wall thickening involving the right colon, distal sigmoid colon and proximal rectum. Normal appearing appendix. Vascular/Lymphatic: No significant vascular findings are present. No enlarged abdominal or pelvic lymph nodes. There are mildly prominent lymph nodes at the root of the mesentery without pathological enlargement. Reproductive: Prostate is unremarkable. Other: Small amount of free peritoneal fluid in the abdomen and small to moderate amount of free peritoneal fluid in the pelvis. Small right inguinal hernia containing fat. Musculoskeletal: Mild lumbar and lower thoracic spine degenerative changes. IMPRESSION: 1. Extensive focal and confluent patchy opacities at both lung bases, compatible with an infectious process. Differential considerations include multilobar pneumonia, septic emboli and marked changes due to viral pneumonitis. 2. Bowel wall abnormalities involving the stomach, small bowel, colon and proximal rectum, compatible with extensive changes of gastroenteritis. 3. Small to moderate amount of free peritoneal fluid in the abdomen and pelvis. 4. Diffuse hepatic steatosis and changes of cirrhosis with associated splenomegaly. 5. Small to moderate-sized hiatal hernia. Electronically Signed    By: Beckie SaltsSteven  Reid M.D.   On: 07/22/2019 16:02   Dg Chest Portable 1 View  Result Date: 07/22/2019 CLINICAL DATA:  Shortness of breath, rapid heart rate, seizure today. EXAM: PORTABLE CHEST 1 VIEW COMPARISON:  Chest x-ray dated 10/13/2013. FINDINGS: Heart size is upper normal. Patchy bilateral airspace opacities, mid and lower lobe predominant. No pleural effusion or pneumothorax seen. Osseous structures about the chest are unremarkable. IMPRESSION: Patchy bilateral airspace opacities, mid and lower lobe predominant, compatible with multifocal pneumonia versus pulmonary edema. Electronically Signed   By: Bary RichardStan  Maynard M.D.   On: 06/29/2019 12:25    EKG: Independently reviewed.  Sinus tachycardia rate 149.  QTc 431.  No significant ST or T wave abnormalities.  No old EKG to compare.  Assessment/Plan Active Problems:   Multifocal pneumonia    Multifocal pneumonia with sepsis-dyspnea, cough, with tachycardia, tachypnea, leukocytosis 16.3.  Lactic acidosis 2.7 >> 2.4.  O2 sats 90% on room air.  ED rapid SARS-CoV-2 test negative.  Portable chest x-ray patchy bilateral airspace opacities, multifocal pneumonia versus pulmonary edema.  Also seen on abdominal CT-extensive focal and confluent patchy opacities in both lungs-differentials multilobar pneumonia,  septic emboli and viral pneumonitis.  2 L bolus given -Continue IV ceftriaxone and azithromycin started in the ED - COVID PUI, will [facility] to [facility] - F/u Send-out COVID test ordered. -As COVID test is negative, at this time does not meets criteria for antivirals or steroids. -CBC, CMP a.m. -Give 1 L bolus totaling 3 L - N/s + 20KCL 75 cc/hr x 15hrs -Trend lactic acid -Urine strep and Legionella antigen -Follow-up blood cultures - obtain Inflammatory markers- elevated.  LDH 528, Ferritin 1220, d-dimer > 20.  CRP pending -Procalcitonin- 1, antibiotic strongly encouraged.  Seizures-reported seizure today.  History of seizures.  Patient is  alcoholic.  Not on antiseizure medications.  Head CT negative for acute abnormality.  Talked to patient spouse extensively.  She reports patient has had seizures his whole life.  That he has one seizure every 2 to 3 years.  She did not seem have a seizure today.  There is no documentation of seizures in patients visit with his primary care provider. - I talked to neurology,Dr. Lorne SkeensKirkpartrick recommended starting IV keppra.  As patient has had seizures his whole life, he will need antiseizure medications, as against if he was having seizures in the setting of alcohol abuse.  No need for neurology to follow-up on admission.  Patient can follow-up with neurology as outpatient - Seizure precautions - IV keppra 1000 mg load, 500 twice daily.  Gastroenteritis-abdominal pain nausea, dry heaving and diarrhea.  With respiratory symptoms suggesting viral etiology. -PRN Zofran -Bowel rest with clear liquid diet for now, advance as tolerated  Elevated liver enzymes in the setting of liver cirrhosis-in the setting of sepsis.  AST 472, ALT 103, ALP 529, total bilirubin 4.3.  Elevated liver enzymes likely secondary to viral infection versus sepsis from bacterial infection.  Liver enzymes normal 1 year ago.  History of esophageal varices requiring ICU admission  2014.  Abdominal CT-gastritis, diffuse hepatic steatosis and changes of liver cirrhosis. -CMP a.m. -Hydrate  Electrolyte abnormalities-hyponatremia-127, hypokalemia-3.4.  Likely from vomiting and loose stools, call abuse.  Baseline sodium 134 - 143.  -Replete electrolytes - CMP a.m -Check magnesium-1.6, replete  Chest pain with elevated high-sensitivity troponin-1692.  In the setting of sepsis from bacterial infection, versus viral infection.  EKG without significant ST or T wave abnormalities.  No cardiac history.  No family history of premature coronary artery disease.  Patient smokes cigarettes.  BNP is also elevated at 548-no prior to compare. -Trend  high-sensitivity troponin 1692>> 1373. - Will Obtain Echo -EKG a.m.  Thrombocytopenia-platelets 99.  Likely secondary to liver cirrhosis and viral infection.  Baseline 122 - 152, 2 years ago. -SCDs for now - CBC a.m  Alcohol abuse-blood alcohol level less than 10.  Drinks a sixpack of beers daily.  Last drink yesterday- ~24hrs ago.  - CIWA -Thiamine, folate, multivitamins -Counselled to quit alcohol intake. -Check magnesium-1.6, phosphorus WNL.   DVT prophylaxis: SCDs for now Code Status: Full Family Communication: None at bedside Disposition Plan: Per rounding team Consults called: None Admission status: Inpatient, tele.  I certify that at the point of admission it is my clinical judgment that the patient will require inpatient hospital care spanning beyond 2 midnights from the point of admission due to high intensity of service, high risk for further deterioration and high frequency of surveillance required. The following factors support the patient status of inpatient: Other focal pneumonia with sepsis, seizures and elevated liver enzymes requiring inpatient admission for IV antibiotics.   Reinhardt Licausi E Miela Desjardin  Sanchez [facility]  06/25/2019, 10:13 PM

## 2019-07-15 NOTE — ED Provider Notes (Addendum)
[facility] EMERGENCY DEPARTMENT Provider Note   CSN: [id number] Arrival date & time: July 17, 2019  1107     History   Chief Complaint Chief Complaint  Patient presents with   Shortness of Breath   Seizures    0300    HPI Adam Sanchez is a 43 y.o. male who presents with shortness of breath.  Past medical history significant for alcoholism, esophageal varices, history of seizures.  Patient states that yesterday he felt very tired after work.  Overnight he was unable to sleep.  This morning he started to become very short of breath and has been coughing.  He reports associated decreased appetite, myalgias, diffuse lower abdominal pain, nausea, dry heaves, and diarrhea. The diarrhea has been going on for about 2 weeks but is getting worse. He also has some mild pain over the center of his chest.  He denies headache, sore throat, known fever, vomiting, decreased urination. Additionally he reports having a seizure this morning. He has hx of seizures and does not take any medicine for this. He works in [employer]. He drinks daily. He denies any IVDU or recent travel. No sick contacts (although his wife states she had pneumonia recently that was not COVID)     HPI  Past Medical History:  Diagnosis Date   Alcoholism (HCC)    Esophageal varices (HCC)    Seizures (HCC)     Patient Active Problem List   Diagnosis Date Noted   Hematemesis 10/12/2013   Alcohol abuse 10/12/2013    Past Surgical History:  Procedure Laterality Date   ESOPHAGOGASTRODUODENOSCOPY N/A 10/12/2013   Procedure: ESOPHAGOGASTRODUODENOSCOPY (EGD);  Surgeon: Shirley Friar, MD;  [facility];  Service: Endoscopy;  Laterality: N/A;        Home Medications    Prior to Admission medications   Medication Sig Start Date End Date Taking? Authorizing Provider  cyclobenzaprine (FLEXERIL) 10 MG tablet Take 1 tablet (10 mg total) by mouth 3 (three) times daily as needed for muscle spasms. 12/10/18    Dettinger, Elige Radon, MD  doxycycline (VIBRA-TABS) 100 MG tablet Take 1 tablet (100 mg total) by mouth 2 (two) times daily. 1 po bid 09/24/18   Bennie Pierini, FNP  K Phos Mono-Sod Phos Judi Cong & Mono (AV-PHOS 250 NEUTRAL) [id number] MG TABS Take 2 tablets by mouth 3 (three) times daily before meals. 07/08/18   Mechele Claude, MD  sertraline (ZOLOFT) 50 MG tablet Take 1 tablet (50 mg total) by mouth daily. (Needs to be seen before next refill) 12/11/18   Dettinger, Elige Radon, MD    Family History History reviewed. No pertinent family history.  Social History Social History   Tobacco Use   Smoking status: Current Every Day Smoker    Packs/day: 0.50    Years: 10.00    Pack years: 5.00    Types: Cigarettes   Smokeless tobacco: Never Used  Substance Use Topics   Alcohol use: Yes    Comment: daily   Drug use: No     Allergies   Patient has no known allergies.   Review of Systems Review of Systems  Constitutional: Positive for appetite change. Negative for fever.  HENT: Negative for sore throat.   Respiratory: Positive for cough and shortness of breath.   Cardiovascular: Positive for chest pain. Negative for palpitations and leg swelling.  Gastrointestinal: Positive for abdominal pain, diarrhea, nausea and vomiting.  Genitourinary: Negative for difficulty urinating and dysuria.  Musculoskeletal: Positive for myalgias. Negative for back pain and neck  pain.  Neurological: Positive for seizures. Negative for syncope and headaches.  All other systems reviewed and are negative.    Physical Exam Updated Vital Signs BP 134/80 (BP Location: Right Arm)    Pulse (!) 146    Temp (!) 102.3 F (39.1 C) (Oral)    Resp (!) 24    SpO2 90%   Physical Exam Vitals signs and nursing note reviewed.  Constitutional:      General: He is not in acute distress.    Appearance: He is well-developed. He is not ill-appearing.     Comments: Calm and cooperative.  HENT:     Head: Normocephalic  and atraumatic.     Nose: Nose normal.     Mouth/Throat:     Mouth: Mucous membranes are moist.  Eyes:     General: Scleral icterus present.        Right eye: No discharge.        Left eye: No discharge.     Conjunctiva/sclera: Conjunctivae normal.     Pupils: Pupils are equal, round, and reactive to light.  Neck:     Musculoskeletal: Normal range of motion.  Cardiovascular:     Rate and Rhythm: Tachycardia present.  Pulmonary:     Effort: Pulmonary effort is normal. Tachypnea present. No respiratory distress.     Breath sounds: Normal breath sounds.  Abdominal:     General: There is no distension.     Palpations: Abdomen is soft.     Tenderness: There is abdominal tenderness (generally).  Skin:    General: Skin is warm and dry.     Comments: Jaundiced  Neurological:     Mental Status: He is alert and oriented to person, place, and time.  Psychiatric:        Mood and Affect: Mood normal.        Behavior: Behavior normal.      ED Treatments / Results  Labs (all labs ordered are listed, but only abnormal results are displayed) Labs Reviewed  CBC WITH DIFFERENTIAL/PLATELET - Abnormal; Notable for the following components:      Result Value   WBC 16.3 (*)    RBC 3.36 (*)    Hemoglobin 11.3 (*)    HCT 33.2 (*)    RDW 17.2 (*)    Platelets 99 (*)    Neutro Abs 14.2 (*)    Abs Immature Granulocytes 0.15 (*)    All other components within normal limits  BASIC METABOLIC PANEL - Abnormal; Notable for the following components:   Sodium 127 (*)    Potassium 3.4 (*)    Chloride 97 (*)    CO2 17 (*)    Glucose, Bld 112 (*)    BUN 5 (*)    Creatinine, Ser 0.55 (*)    Calcium 7.5 (*)    All other components within normal limits  HEPATIC FUNCTION PANEL - Abnormal; Notable for the following components:   Total Protein 8.9 (*)    Albumin 1.7 (*)    AST 472 (*)    ALT 103 (*)    Alkaline Phosphatase 529 (*)    Total Bilirubin 4.3 (*)    Bilirubin, Direct 2.5 (*)     Indirect Bilirubin 1.8 (*)    All other components within normal limits  BRAIN NATRIURETIC PEPTIDE - Abnormal; Notable for the following components:   B Natriuretic Peptide 548.0 (*)    All other components within normal limits  LACTIC ACID, PLASMA - Abnormal; Notable for  the following components:   Lactic Acid, Venous 2.7 (*)    All other components within normal limits  LACTIC ACID, PLASMA - Abnormal; Notable for the following components:   Lactic Acid, Venous 2.4 (*)    All other components within normal limits  TROPONIN I (HIGH SENSITIVITY) - Abnormal; Notable for the following components:   Troponin I (High Sensitivity) 1,692 (*)    All other components within normal limits  SARS CORONAVIRUS 2 (HOSPITAL ORDER, PERFORMED IN [facility])  CULTURE, BLOOD (ROUTINE X 2)  CULTURE, BLOOD (ROUTINE X 2)  URINALYSIS, ROUTINE W REFLEX MICROSCOPIC    EKG EKG Interpretation  Date/Time:  Tuesday July 15 2019 11:28:50 EDT Ventricular Rate:  147 PR Interval:  120 QRS Duration: 74 QT Interval:  276 QTC Calculation: 431 R Axis:   38 Text Interpretation:  Sinus tachycardia Otherwise normal ECG Confirmed by Benjiman CorePickering, Nathan [phone]([id number]) on 07/07/2019 12:50:08 PM   Radiology Ct Head Wo Contrast  Result Date: 06/26/2019 CLINICAL DATA:  Seizure this morning. EXAM: CT HEAD WITHOUT CONTRAST TECHNIQUE: Contiguous axial images were obtained from the base of the skull through the vertex without intravenous contrast. COMPARISON:  None. FINDINGS: Brain: Small cortical calcification in the left frontoparietal region and small cortical calcification in the right parieto-occipital region. Otherwise, normal appearing cerebral hemispheres and posterior fossa structures. Normal size and position of the ventricles. No intracranial hemorrhage, mass lesion or CT evidence of acute infarction. Vascular: No hyperdense vessel or unexpected calcification. Skull: Normal. Negative for fracture or focal lesion.  Sinuses/Orbits: Unremarkable. Other: None. IMPRESSION: 1. No acute abnormality. 2. Two small brain cortical calcifications, as described above. These have benign features, possibly related to previous infection such as cysticercosis or previous granulomatous infection. Electronically Signed   By: Beckie SaltsSteven  Reid M.D.   On: 06/27/2019 16:10   Ct Abdomen Pelvis W Contrast  Result Date: 07/07/2019 CLINICAL DATA:  Acute, generalized abdominal pain, primarily in the lower abdomen with nausea, vomiting and diarrhea since last night. EXAM: CT ABDOMEN AND PELVIS WITH CONTRAST TECHNIQUE: Multidetector CT imaging of the abdomen and pelvis was performed using the standard protocol following bolus administration of intravenous contrast. CONTRAST:  100mL OMNIPAQUE IOHEXOL 300 MG/ML  SOLN COMPARISON:  None. FINDINGS: Lower chest: Large number of small, focal and confluent patchy opacities at both lung bases, involving both lower lobes, right middle lobe and lingula. No pleural fluid. Hepatobiliary: Diffuse low density of the liver relative to the spleen. Enlarged lateral segment left lobe and mildly enlarged caudate lobe and mildly irregular liver contours. Pancreas: Unremarkable. No pancreatic ductal dilatation or surrounding inflammatory changes. Spleen: Enlarged, measuring 16.2 cm in length. Adrenals/Urinary Tract: Adrenal glands are unremarkable. Kidneys are normal, without renal calculi, focal lesion, or hydronephrosis. Bladder is unremarkable. Stomach/Bowel: Small to moderate-sized hiatal hernia. Moderate diffuse, low density proximal gastric wall thickening. Mild diffuse small bowel wall thickening and enhancement. There is also some diffuse low density wall thickening involving the right colon, distal sigmoid colon and proximal rectum. Normal appearing appendix. Vascular/Lymphatic: No significant vascular findings are present. No enlarged abdominal or pelvic lymph nodes. There are mildly prominent lymph nodes at the root  of the mesentery without pathological enlargement. Reproductive: Prostate is unremarkable. Other: Small amount of free peritoneal fluid in the abdomen and small to moderate amount of free peritoneal fluid in the pelvis. Small right inguinal hernia containing fat. Musculoskeletal: Mild lumbar and lower thoracic spine degenerative changes. IMPRESSION: 1. Extensive focal and confluent patchy opacities at both lung  bases, compatible with an infectious process. Differential considerations include multilobar pneumonia, septic emboli and marked changes due to viral pneumonitis. 2. Bowel wall abnormalities involving the stomach, small bowel, colon and proximal rectum, compatible with extensive changes of gastroenteritis. 3. Small to moderate amount of free peritoneal fluid in the abdomen and pelvis. 4. Diffuse hepatic steatosis and changes of cirrhosis with associated splenomegaly. 5. Small to moderate-sized hiatal hernia. Electronically Signed   By: Beckie SaltsSteven  Reid M.D.   On: 12-28-18 16:02   Dg Chest Portable 1 View  Result Date: 04-27-2019 CLINICAL DATA:  Shortness of breath, rapid heart rate, seizure today. EXAM: PORTABLE CHEST 1 VIEW COMPARISON:  Chest x-ray dated 10/13/2013. FINDINGS: Heart size is upper normal. Patchy bilateral airspace opacities, mid and lower lobe predominant. No pleural effusion or pneumothorax seen. Osseous structures about the chest are unremarkable. IMPRESSION: Patchy bilateral airspace opacities, mid and lower lobe predominant, compatible with multifocal pneumonia versus pulmonary edema. Electronically Signed   By: Bary RichardStan  Maynard M.D.   On: 12-28-18 12:25    Procedures Procedures (including critical care time)  CRITICAL CARE Performed by: Bethel BornKelly Marie Camran Keady   Total critical care time: 40 minutes  Critical care time was exclusive of separately billable procedures and treating other patients.  Critical care was necessary to treat or prevent imminent or life-threatening  deterioration.  Critical care was time spent personally by me on the following activities: development of treatment plan with patient and/or surrogate as well as nursing, discussions with consultants, evaluation of patient's response to treatment, examination of patient, obtaining history from patient or surrogate, ordering and performing treatments and interventions, ordering and review of laboratory studies, ordering and review of radiographic studies, pulse oximetry and re-evaluation of patient's condition.   Medications Ordered in ED Medications  sodium chloride 0.9 % bolus 1,000 mL (has no administration in time range)  LORazepam (ATIVAN) injection 0-4 mg (has no administration in time range)    Or  LORazepam (ATIVAN) tablet 0-4 mg (has no administration in time range)  LORazepam (ATIVAN) injection 0-4 mg (has no administration in time range)    Or  LORazepam (ATIVAN) tablet 0-4 mg (has no administration in time range)  thiamine (VITAMIN B-1) tablet 100 mg (has no administration in time range)    Or  thiamine (B-1) injection 100 mg (has no administration in time range)  sodium chloride 0.9 % bolus 1,000 mL (0 mLs Intravenous Stopped 11/08/2019 1643)  potassium chloride SA (K-DUR) CR tablet 40 mEq (40 mEq Oral Given 11/08/2019 1342)  cefTRIAXone (ROCEPHIN) 1 g in sodium chloride 0.9 % 100 mL IVPB (0 g Intravenous Stopped 11/08/2019 1418)  azithromycin (ZITHROMAX) tablet 500 mg (500 mg Oral Given 11/08/2019 1342)  ibuprofen (ADVIL) tablet 400 mg (400 mg Oral Given 11/08/2019 1342)  iohexol (OMNIPAQUE) 300 MG/ML solution 100 mL (100 mLs Intravenous Contrast Given 11/08/2019 1519)     Initial Impression / Assessment and Plan / ED Course  I have reviewed the triage vital signs and the nursing notes.  Pertinent labs & imaging results that were available during my care of the patient were reviewed by me and considered in my medical decision making (see chart for details).  43 year old male presents with  fever and SOB. He is significantly tachycardic with HR in the 140s. He is febrile and mildly tachypneic. BP is low-normal. He is also borderline hypoxic and was put on 3L via [place] in triage. On exam, he is tachycardic, lungs are CTA, abdomen is soft and  generally tender. He appears jaundiced and has scleral icterus. EKG is sinus tachycardia. CXR shows multifocal pneumonia vs pulmonary edema. COVID test, CBC, BMP are pending. Will add LFTs, lipase, UA, and BNP.  Troponin ordered in triage is 1692. Pt is having mild left sided chest pain. CBC shows leukocytosis of 16. CMP is remarkable for multiple derangements. Na is 127, K is 3.4, bicarb is 17, AP is 529, AST 472, ALT is 103, and bili is 4.2. He states he drinks daily but has also been having abdominal pain for a couple weeks with N/V/D. He also tells me later that he had a seizure. He reports hx of seizures but is not on meds for this and I do not see he's ever had a head CT. Will obtain CT head and CT abdomen/pelvis. BNP is 548. Code sepsis was initiated and lactate and blood cultures were ordered. Ceftriaxone and Azithromycin ordered for tx of pneumonia. COVID is negative.  Initial lactate is 2.7.   CT head is negative. CT abdomen/pelvis shows "bowel abnormalities" consistent with gastroenteritis along with evidence of cirrhosis and splenomegaly.   Will admit for further management for sepsis due to pneumonia. Spoke with Dr. Deitra Mayo who will admit  Final Clinical Impressions(s) / ED Diagnoses   Final diagnoses:  Multifocal pneumonia  Hepatic cirrhosis, unspecified hepatic cirrhosis type, unspecified whether ascites present [facility])    ED Discharge Orders    None       Iris Pert 2019/07/27 1729    Davonna Belling, MD 07/21/19 2154    Recardo Evangelist, PA-C 08/15/19 [place], Nathan, MD 08/21/19 [phone]

## 2019-07-15 NOTE — ED Notes (Signed)
Pt also states he had a seizure early this morning.

## 2019-07-16 ENCOUNTER — Encounter (HOSPITAL_COMMUNITY): Admission: EM | Disposition: E | Payer: Self-pay | Source: Home / Self Care | Attending: Internal Medicine

## 2019-07-16 ENCOUNTER — Inpatient Hospital Stay (HOSPITAL_COMMUNITY): Payer: Self-pay

## 2019-07-16 DIAGNOSIS — R740 Nonspecific elevation of levels of transaminase and lactic acid dehydrogenase [LDH]: Secondary | ICD-10-CM

## 2019-07-16 DIAGNOSIS — J9601 Acute respiratory failure with hypoxia: Secondary | ICD-10-CM

## 2019-07-16 DIAGNOSIS — K922 Gastrointestinal hemorrhage, unspecified: Secondary | ICD-10-CM

## 2019-07-16 DIAGNOSIS — K7460 Unspecified cirrhosis of liver: Secondary | ICD-10-CM

## 2019-07-16 DIAGNOSIS — J189 Pneumonia, unspecified organism: Secondary | ICD-10-CM

## 2019-07-16 DIAGNOSIS — R6521 Severe sepsis with septic shock: Secondary | ICD-10-CM

## 2019-07-16 DIAGNOSIS — A419 Sepsis, unspecified organism: Principal | ICD-10-CM

## 2019-07-16 HISTORY — PX: ESOPHAGOGASTRODUODENOSCOPY: SHX5428

## 2019-07-16 LAB — COMPREHENSIVE METABOLIC PANEL
ALT: 83 U/L — ABNORMAL HIGH (ref 0–44)
ALT: 70 U/L — ABNORMAL HIGH (ref 0–44)
AST: 357 U/L — ABNORMAL HIGH (ref 15–41)
AST: 289 U/L — ABNORMAL HIGH (ref 15–41)
Albumin: 1.5 g/dL — ABNORMAL LOW (ref 3.5–5.0)
Albumin: 1.1 g/dL — ABNORMAL LOW (ref 3.5–5.0)
Alkaline Phosphatase: 408 U/L — ABNORMAL HIGH (ref 38–126)
Alkaline Phosphatase: 315 U/L — ABNORMAL HIGH (ref 38–126)
Anion gap: 9 (ref 5–15)
Anion gap: 8 (ref 5–15)
BUN: 6 mg/dL (ref 6–20)
BUN: 7 mg/dL (ref 6–20)
CO2: 17 mmol/L — ABNORMAL LOW (ref 22–32)
CO2: 16 mmol/L — ABNORMAL LOW (ref 22–32)
Calcium: 7.1 mg/dL — ABNORMAL LOW (ref 8.9–10.3)
Calcium: 6.9 mg/dL — ABNORMAL LOW (ref 8.9–10.3)
Chloride: 106 mmol/L (ref 98–111)
Chloride: 107 mmol/L (ref 98–111)
Creatinine, Ser: 0.38 mg/dL — ABNORMAL LOW (ref 0.61–1.24)
Creatinine, Ser: 0.72 mg/dL (ref 0.61–1.24)
GFR calc Af Amer: >60 mL/min (ref >60)
GFR calc Af Amer: >60 mL/min (ref >60)
GFR calc non Af Amer: >60 mL/min (ref >60)
GFR calc non Af Amer: >60 mL/min (ref >60)
Glucose, Bld: 89 mg/dL (ref 70–99)
Glucose, Bld: 103 mg/dL — ABNORMAL HIGH (ref 70–99)
Potassium: 3.7 mmol/L (ref 3.5–5.1)
Potassium: 4.1 mmol/L (ref 3.5–5.1)
Sodium: 132 mmol/L — ABNORMAL LOW (ref 135–145)
Sodium: 131 mmol/L — ABNORMAL LOW (ref 135–145)
Total Bilirubin: 6 mg/dL — ABNORMAL HIGH (ref 0.3–1.2)
Total Bilirubin: 5.6 mg/dL — ABNORMAL HIGH (ref 0.3–1.2)
Total Protein: 7.5 g/dL (ref 6.5–8.1)
Total Protein: 6.5 g/dL (ref 6.5–8.1)

## 2019-07-16 LAB — BLOOD GAS, ARTERIAL
Acid-base deficit: 4.2 mmol/L — ABNORMAL HIGH (ref 0.0–2.0)
Bicarbonate: 18.6 mmol/L — ABNORMAL LOW (ref 20.0–28.0)
Drawn by: 36527
O2 Content: 7 L/min
O2 Saturation: 94.2 %
Patient temperature: 99.3
pCO2 arterial: 24.8 mmHg — ABNORMAL LOW (ref 32.0–48.0)
pH, Arterial: 7.489 — ABNORMAL HIGH (ref 7.350–7.450)
pO2, Arterial: 70 mmHg — ABNORMAL LOW (ref 83.0–108.0)

## 2019-07-16 LAB — CBC WITH DIFFERENTIAL/PLATELET
Abs Immature Granulocytes: 0.14 10*3/uL — ABNORMAL HIGH (ref 0.00–0.07)
Basophils Absolute: 0 10*3/uL (ref 0.0–0.1)
Basophils Relative: 0 %
Eosinophils Absolute: 0.1 10*3/uL (ref 0.0–0.5)
Eosinophils Relative: 1 %
HCT: 33 % — ABNORMAL LOW (ref 39.0–52.0)
Hemoglobin: 10.8 g/dL — ABNORMAL LOW (ref 13.0–17.0)
Immature Granulocytes: 1 %
Lymphocytes Relative: 7 %
Lymphs Abs: 1 10*3/uL (ref 0.7–4.0)
MCH: 33.6 pg (ref 26.0–34.0)
MCHC: 32.7 g/dL (ref 30.0–36.0)
MCV: 102.8 fL — ABNORMAL HIGH (ref 80.0–100.0)
Monocytes Absolute: 1 10*3/uL (ref 0.1–1.0)
Monocytes Relative: 7 %
Neutro Abs: 11.8 10*3/uL — ABNORMAL HIGH (ref 1.7–7.7)
Neutrophils Relative %: 84 %
Platelets: 85 10*3/uL — ABNORMAL LOW (ref 150–400)
RBC: 3.21 MIL/uL — ABNORMAL LOW (ref 4.22–5.81)
RDW: 17.8 % — ABNORMAL HIGH (ref 11.5–15.5)
WBC: 14.1 10*3/uL — ABNORMAL HIGH (ref 4.0–10.5)
nRBC: 0.2 % (ref 0.0–0.2)

## 2019-07-16 LAB — POCT I-STAT 7, (LYTES, BLD GAS, ICA,H+H)
Acid-base deficit: 14 mmol/L — ABNORMAL HIGH (ref 0.0–2.0)
Acid-base deficit: 10 mmol/L — ABNORMAL HIGH (ref 0.0–2.0)
Bicarbonate: 14.1 mmol/L — ABNORMAL LOW (ref 20.0–28.0)
Bicarbonate: 17.2 mmol/L — ABNORMAL LOW (ref 20.0–28.0)
Calcium, Ion: 0.96 mmol/L — ABNORMAL LOW (ref 1.15–1.40)
Calcium, Ion: 0.95 mmol/L — ABNORMAL LOW (ref 1.15–1.40)
HCT: 27 % — ABNORMAL LOW (ref 39.0–52.0)
HCT: 18 % — ABNORMAL LOW (ref 39.0–52.0)
Hemoglobin: 9.2 g/dL — ABNORMAL LOW (ref 13.0–17.0)
Hemoglobin: 6.1 g/dL — CL (ref 13.0–17.0)
O2 Saturation: 99 %
O2 Saturation: 96 %
Patient temperature: 99.3
Potassium: 4.1 mmol/L (ref 3.5–5.1)
Potassium: 5.1 mmol/L (ref 3.5–5.1)
Sodium: 138 mmol/L (ref 135–145)
Sodium: 139 mmol/L (ref 135–145)
TCO2: 15 mmol/L — ABNORMAL LOW (ref 22–32)
TCO2: 19 mmol/L — ABNORMAL LOW (ref 22–32)
pCO2 arterial: 40.9 mmHg (ref 32.0–48.0)
pCO2 arterial: 45.9 mmHg (ref 32.0–48.0)
pH, Arterial: 7.144 — CL (ref 7.350–7.450)
pH, Arterial: 7.184 — CL (ref 7.350–7.450)
pO2, Arterial: 156 mmHg — ABNORMAL HIGH (ref 83.0–108.0)
pO2, Arterial: 100 mmHg (ref 83.0–108.0)

## 2019-07-16 LAB — ABO/RH: ABO/RH(D): A POS

## 2019-07-16 LAB — CBC
HCT: 26 % — ABNORMAL LOW (ref 39.0–52.0)
Hemoglobin: 8.2 g/dL — ABNORMAL LOW (ref 13.0–17.0)
MCH: 33.6 pg (ref 26.0–34.0)
MCHC: 31.5 g/dL (ref 30.0–36.0)
MCV: 106.6 fL — ABNORMAL HIGH (ref 80.0–100.0)
Platelets: 143 10*3/uL — ABNORMAL LOW (ref 150–400)
RBC: 2.44 MIL/uL — ABNORMAL LOW (ref 4.22–5.81)
RDW: 18.1 % — ABNORMAL HIGH (ref 11.5–15.5)
WBC: 26.2 10*3/uL — ABNORMAL HIGH (ref 4.0–10.5)
nRBC: 0.3 % — ABNORMAL HIGH (ref 0.0–0.2)

## 2019-07-16 LAB — PROTIME-INR
INR: 1.7 — ABNORMAL HIGH (ref 0.8–1.2)
Prothrombin Time: 19.6 seconds — ABNORMAL HIGH (ref 11.4–15.2)

## 2019-07-16 LAB — TROPONIN I (HIGH SENSITIVITY)
Troponin I (High Sensitivity): 1233 ng/L (ref <18)
Troponin I (High Sensitivity): 1606 ng/L (ref <18)
Troponin I (High Sensitivity): 2492 ng/L (ref <18)

## 2019-07-16 LAB — INTERLEUKIN-6, PLASMA: Interleukin-6, Plasma: 201.5 pg/mL — ABNORMAL HIGH (ref 0.0–12.2)

## 2019-07-16 LAB — LACTIC ACID, PLASMA
Lactic Acid, Venous: 2.8 mmol/L (ref 0.5–1.9)
Lactic Acid, Venous: >11 mmol/L (ref 0.5–1.9)

## 2019-07-16 LAB — NOVEL CORONAVIRUS, NAA (HOSP ORDER, SEND-OUT TO REF LAB; TAT 18-24 HRS): SARS-CoV-2, NAA: NOT DETECTED

## 2019-07-16 LAB — PREPARE RBC (CROSSMATCH)

## 2019-07-16 LAB — STREP PNEUMONIAE URINARY ANTIGEN: Strep Pneumo Urinary Antigen: NEGATIVE

## 2019-07-16 LAB — HIV ANTIBODY (ROUTINE TESTING W REFLEX): HIV Screen 4th Generation wRfx: NONREACTIVE

## 2019-07-16 SURGERY — EGD (ESOPHAGOGASTRODUODENOSCOPY)
Anesthesia: Moderate Sedation

## 2019-07-16 MED ORDER — SODIUM BICARBONATE 8.4 % IV SOLN
100.0000 meq | Freq: Once | INTRAVENOUS | Status: AC
  Administered 2019-07-16: 100 meq via INTRAVENOUS

## 2019-07-16 MED ORDER — FENTANYL CITRATE (PF) 100 MCG/2ML IJ SOLN: 50.0000 ug | Freq: Once | INTRAMUSCULAR | Status: DC

## 2019-07-16 MED ORDER — POLYETHYLENE GLYCOL 3350 17 G PO PACK: 17.0000 g | PACK | Freq: Every day | ORAL | Status: DC | PRN

## 2019-07-16 MED ORDER — SODIUM CHLORIDE 0.9% IV SOLUTION: Freq: Once | INTRAVENOUS | Status: DC

## 2019-07-16 MED ORDER — OCTREOTIDE LOAD VIA INFUSION
50.0000 ug | Freq: Once | INTRAVENOUS | Status: DC
  Filled 2019-07-16: qty 25

## 2019-07-16 MED ORDER — MIDAZOLAM BOLUS VIA INFUSION
1.0000 mg | INTRAVENOUS | Status: DC | PRN
  Filled 2019-07-16: qty 2

## 2019-07-16 MED ORDER — FENTANYL 2500MCG IN NS 250ML (10MCG/ML) PREMIX INFUSION
50.0000 ug/h | INTRAVENOUS | Status: DC
  Filled 2019-07-16: qty 250

## 2019-07-16 MED ORDER — POTASSIUM CHLORIDE IN NACL 20-0.9 MEQ/L-% IV SOLN
INTRAVENOUS | Status: DC
  Administered 2019-07-16: 04:00:00 via INTRAVENOUS
  Filled 2019-07-16 (×2): qty 1000

## 2019-07-16 MED ORDER — SODIUM CHLORIDE 0.9 % IV BOLUS
500.0000 mL | Freq: Once | INTRAVENOUS | Status: AC
  Administered 2019-07-16: 500 mL via INTRAVENOUS

## 2019-07-16 MED ORDER — SODIUM BICARBONATE 8.4 % IV SOLN
INTRAVENOUS | Status: AC
  Filled 2019-07-16: qty 50

## 2019-07-16 MED ORDER — SODIUM BICARBONATE 8.4 % IV SOLN
INTRAVENOUS | Status: DC
  Filled 2019-07-16: qty 150

## 2019-07-16 MED ORDER — NOREPINEPHRINE 4 MG/250ML-% IV SOLN
0.0000 ug/min | INTRAVENOUS | Status: DC
  Filled 2019-07-16: qty 500

## 2019-07-16 MED ORDER — PANTOPRAZOLE SODIUM 40 MG IV SOLR: 40.0000 mg | Freq: Two times a day (BID) | INTRAVENOUS | Status: DC

## 2019-07-16 MED ORDER — FOLIC ACID 1 MG PO TABS: 1.0000 mg | ORAL_TABLET | Freq: Every day | ORAL | Status: DC

## 2019-07-16 MED ORDER — SODIUM BICARBONATE 8.4 % IV SOLN
INTRAVENOUS | Status: AC
  Filled 2019-07-16: qty 100

## 2019-07-16 MED ORDER — SODIUM CHLORIDE 0.9 % IV SOLN
25.0000 ug/h | INTRAVENOUS | Status: DC
  Administered 2019-07-16: 25 ug/h via INTRAVENOUS
  Filled 2019-07-16 (×2): qty 1

## 2019-07-16 MED ORDER — ONDANSETRON HCL 4 MG PO TABS: 4.0000 mg | ORAL_TABLET | Freq: Four times a day (QID) | ORAL | Status: DC | PRN

## 2019-07-16 MED ORDER — SODIUM CHLORIDE 0.9 % IV SOLN
8.0000 mg/h | INTRAVENOUS | Status: DC
  Administered 2019-07-16: 8 mg/h via INTRAVENOUS
  Filled 2019-07-16 (×3): qty 80

## 2019-07-16 MED ORDER — EPINEPHRINE 1 MG/10ML IJ SOSY
PREFILLED_SYRINGE | INTRAMUSCULAR | Status: AC
  Filled 2019-07-16: qty 40

## 2019-07-16 MED ORDER — CALCIUM GLUCONATE-NACL 1-0.675 GM/50ML-% IV SOLN
1.0000 g | Freq: Once | INTRAVENOUS | Status: DC
  Filled 2019-07-16: qty 50

## 2019-07-16 MED ORDER — FENTANYL BOLUS VIA INFUSION
50.0000 ug | INTRAVENOUS | Status: DC | PRN
  Filled 2019-07-16: qty 50

## 2019-07-16 MED ORDER — LORAZEPAM 2 MG/ML IJ SOLN: 1.0000 mg | INTRAMUSCULAR | Status: DC | PRN

## 2019-07-16 MED ORDER — SODIUM CHLORIDE 0.9 % IV SOLN
500.0000 mg | INTRAVENOUS | Status: DC
  Filled 2019-07-16: qty 500

## 2019-07-16 MED ORDER — LORAZEPAM 1 MG PO TABS
1.0000 mg | ORAL_TABLET | Freq: Four times a day (QID) | ORAL | Status: DC | PRN
  Administered 2019-07-16: 1 mg via ORAL
  Filled 2019-07-16: qty 1

## 2019-07-16 MED ORDER — MIDAZOLAM HCL 2 MG/2ML IJ SOLN
INTRAMUSCULAR | Status: AC
  Administered 2019-07-16: 09:00:00
  Filled 2019-07-16: qty 2

## 2019-07-16 MED ORDER — SODIUM BICARBONATE 8.4 % IV SOLN
INTRAVENOUS | Status: AC
  Administered 2019-07-16: 100 meq via INTRAVENOUS
  Filled 2019-07-16: qty 100

## 2019-07-16 MED ORDER — SODIUM CHLORIDE 0.9 % IV SOLN: 25.0000 ug/h | INTRAVENOUS | Status: DC

## 2019-07-16 MED ORDER — NOREPINEPHRINE 16 MG/250ML-% IV SOLN
0.0000 ug/min | INTRAVENOUS | Status: DC
  Filled 2019-07-16: qty 250

## 2019-07-16 MED ORDER — ACETAMINOPHEN 325 MG PO TABS: 650.0000 mg | ORAL_TABLET | Freq: Four times a day (QID) | ORAL | Status: DC | PRN

## 2019-07-16 MED ORDER — LORAZEPAM 2 MG/ML IJ SOLN: 1.0000 mg | Freq: Four times a day (QID) | INTRAMUSCULAR | Status: DC | PRN

## 2019-07-16 MED ORDER — CHLORHEXIDINE GLUCONATE CLOTH 2 % EX PADS: 6.0000 | MEDICATED_PAD | Freq: Every day | CUTANEOUS | Status: DC

## 2019-07-16 MED ORDER — ADULT MULTIVITAMIN W/MINERALS CH: 1.0000 | ORAL_TABLET | Freq: Every day | ORAL | Status: DC

## 2019-07-16 MED ORDER — MIDAZOLAM 50MG/50ML (1MG/ML) PREMIX INFUSION
0.0000 mg/h | INTRAVENOUS | Status: DC
  Filled 2019-07-16 (×2): qty 50

## 2019-07-16 MED ORDER — EPINEPHRINE PF 1 MG/ML IJ SOLN
0.5000 ug/min | INTRAVENOUS | Status: DC
  Filled 2019-07-16 (×2): qty 4

## 2019-07-16 MED ORDER — FENTANYL CITRATE (PF) 100 MCG/2ML IJ SOLN
INTRAMUSCULAR | Status: AC
  Filled 2019-07-16: qty 2

## 2019-07-16 MED ORDER — SODIUM CHLORIDE 0.9 % IV BOLUS
1000.0000 mL | Freq: Once | INTRAVENOUS | Status: AC
  Administered 2019-07-16: 1000 mL via INTRAVENOUS

## 2019-07-16 MED ORDER — NOREPINEPHRINE 4 MG/250ML-% IV SOLN
INTRAVENOUS | Status: AC
  Administered 2019-07-16: 11:00:00
  Filled 2019-07-16: qty 250

## 2019-07-16 MED ORDER — SODIUM CHLORIDE 0.9 % IV SOLN
1.0000 g | INTRAVENOUS | Status: DC
  Filled 2019-07-16: qty 10

## 2019-07-16 MED ORDER — NOREPINEPHRINE 4 MG/250ML-% IV SOLN
INTRAVENOUS | Status: AC
  Filled 2019-07-16: qty 250

## 2019-07-16 MED ORDER — GUAIFENESIN-DM 100-10 MG/5ML PO SYRP
5.0000 mL | ORAL_SOLUTION | ORAL | Status: DC | PRN
  Administered 2019-07-16: 5 mL via ORAL
  Filled 2019-07-16 (×2): qty 5

## 2019-07-16 MED ORDER — ONDANSETRON HCL 4 MG/2ML IJ SOLN
4.0000 mg | Freq: Four times a day (QID) | INTRAMUSCULAR | Status: DC | PRN
  Administered 2019-07-16: 4 mg via INTRAVENOUS
  Filled 2019-07-16: qty 2

## 2019-07-16 MED ORDER — SODIUM CHLORIDE 0.9 % IV SOLN: INTRAVENOUS | Status: DC

## 2019-07-16 MED FILL — Medication: Qty: 1 | Status: AC

## 2019-07-17 ENCOUNTER — Encounter (HOSPITAL_COMMUNITY): Payer: Self-pay | Admitting: Gastroenterology

## 2019-07-17 LAB — TYPE AND SCREEN
ABO/RH(D): A POS
Antibody Screen: NEGATIVE
Unit division: 0
Unit division: 0
Unit division: 0
Unit division: 0

## 2019-07-17 LAB — BPAM RBC
Blood Product Expiration Date: 202008102359
Blood Product Expiration Date: 202008132359
Blood Product Expiration Date: 202008132359
Blood Product Expiration Date: 202008132359
ISSUE DATE / TIME: 202007221228
ISSUE DATE / TIME: 202007221228
ISSUE DATE / TIME: 202007221351
ISSUE DATE / TIME: 202007222352
Unit Type and Rh: 6200
Unit Type and Rh: 6200
Unit Type and Rh: 6200
Unit Type and Rh: 6200

## 2019-07-17 LAB — LEGIONELLA PNEUMOPHILA SEROGP 1 UR AG: L. pneumophila Serogp 1 Ur Ag: NEGATIVE

## 2019-07-17 MED FILL — Medication: Qty: 1 | Status: AC

## 2019-07-20 LAB — CULTURE, BLOOD (ROUTINE X 2)
Culture: NO GROWTH
Culture: NO GROWTH
Special Requests: ADEQUATE

## 2019-07-26 NOTE — Progress Notes (Signed)
This Probation officer spoke to Adam Sanchez the [place] on call who stated Adam Sanchez was not a medical examiner case.  [name], RN

## 2019-07-26 NOTE — Care Management (Signed)
CM contacted pts wife per consult regarding medical bills with current admission.  CM explained that a FC will assess pt and reach out to wife during hospitalization to determine if pt is appropriate for a medicaid application.  CM also informed pt that if medicaid is not deemed appropriative she can can request assistance consideration from [facility] once bill is received.

## 2019-07-26 NOTE — Procedures (Signed)
Central Venous Catheter Insertion Procedure Note Adam Sanchez [id number] 12-30-1975  Procedure: Insertion of Central Venous Catheter Indications: Assessment of intravascular volume, Drug and/or fluid administration and Frequent blood sampling  Procedure Details Consent: Unable to obtain consent because of emergent medical necessity. Time Out: Verified patient identification, verified procedure, site/side was marked, verified correct patient position, special equipment/implants available, medications/allergies/relevent history reviewed, required imaging and test results available.  Performed  Maximum sterile technique was used including antiseptics, cap, gloves, gown, hand hygiene, mask and sheet. Skin prep: Chlorhexidine; local anesthetic administered A antimicrobial bonded/coated triple lumen catheter was placed in the right femoral vein due to emergent situation using the Seldinger technique. Ultrasound guidance used.Yes.   Catheter placed to 20 cm. Blood aspirated via all 3 ports and then flushed x 3. Line sutured x 2 and dressing applied.  Evaluation Blood flow good Complications: No apparent complications Patient did tolerate procedure well. Chest X-ray ordered to verify placement.  CXR: not needed.  Adam Sanchez ACNP [facility] PCCM Pager [phone] till 1 pm If no answer page [phone] 07/24/2019, 10:30 AM

## 2019-07-26 NOTE — Procedures (Signed)
Arterial Catheter Insertion Procedure Note Burlie Cajamarca [id number] [birth year]  Procedure: Insertion of Arterial Catheter  Indications: Blood pressure monitoring and Frequent blood sampling  Procedure Details Consent: Unable to obtain consent because of emergent medical necessity. Time Out: Verified patient identification, verified procedure, site/side was marked, verified correct patient position, special equipment/implants available, medications/allergies/relevent history reviewed, required imaging and test results available.  Performed  Maximum sterile technique was used including antiseptics, cap, gloves, gown, hand hygiene, mask and sheet. Skin prep: Chlorhexidine; local anesthetic administered 20 gauge catheter was inserted into right femoral artery using the Seldinger technique.  Evaluation Blood flow good; BP tracing good. Complications: No apparent complications.   Richardson Landry Minor ACNP [facility] PCCM Pager [phone] till 3 pm If no answer page [phone] 07/11/2019, 10:32 AM

## 2019-07-26 NOTE — Progress Notes (Signed)
Telemetry called RN and notified heart rate in 170s and oxygen dropping.  Rapid team called to come to bedside and RN requested ICU transfer.

## 2019-07-26 NOTE — Progress Notes (Signed)
Pt arrived to unit via EMS. Placed patient on telemetry and obtained vitals. Pt required an increase to 5L [place]. HR in 140s. Pt had an episode of vomiting a moderate amount of blood including clots. Tylene Fantasia paged and notified of VS and vomiting of blood. 578ml bolus ordered and administered. IV zofran and robitussin given as well. Pt requiring increase to 7L [place], Respiratory notified of increase in O2.

## 2019-07-26 NOTE — Progress Notes (Signed)
RT obtained ABG and reported results to Richardson Landry Minor, NP. Results as follows: pH 7.19, CO2 45.1, PO2 98, and HCO3 17.2.  No new orders from [place], NP at this time. RT will continue to monitor.

## 2019-07-26 NOTE — Consult Note (Signed)
Referring Provider:  Dr. Eddie North Primary Care Physician:  Dettinger, Elige Radon, MD Primary Gastroenterologist:  Dr. Doy Mince  Reason for Consultation:  hematemesis  HPI: Adam Sanchez is a 43 y.o. male w/ known Etoh-related liver disease w/ varices last seen by our practice several yrs ago adm yest b/o SOB, evid of PNA on imaging, also hematemesis, elev LFT's, ?sepsis, elev troponins.   Had further hematemesis, intubated, coded, moved to ICU, had another (bradycardic) arrest, acidotic, on pressors.  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  Spoke w/ Dr. Myrla Halsted of Crit [facility] who feels that we should proceed w/ egd.  Spoke w/ pt's wife, Tobi Bastos, on telephone--explained it's a high risk procedure but w/ pt intubated, the most risky elements associated w/ egd (hypoxemia and aspiration) would be protected against.  She is agreeable to proceeding w/ egd.  In the meantime, while outside the door preparing to move in w/ egd equipment, pt coded again and is currently undergoing CPR.     Past Medical History:  Diagnosis Date  . Alcoholism (HCC)   . Esophageal varices (HCC)   . Seizures (HCC)     Past Surgical History:  Procedure Laterality Date  . ESOPHAGOGASTRODUODENOSCOPY N/A 10/12/2013   Procedure: ESOPHAGOGASTRODUODENOSCOPY (EGD);  Surgeon: Shirley Friar, MD;  Location: [facility] ENDOSCOPY;  Service: Endoscopy;  Laterality: N/A;    Prior to Admission medications   Medication Sig Start Date End Date Taking? Authorizing Provider  acetaminophen (TYLENOL) 500 MG tablet Take 500 mg by mouth every 6 (six) hours as needed for mild pain.   Yes [provider]  diphenhydrAMINE (BENADRYL) 25 MG tablet Take 25 mg by mouth every 6 (six) hours as needed for allergies.   Yes [provider]  Pseudoeph-Doxylamine-DM-APAP (NYQUIL PO) Take 15-30 mLs by mouth daily as needed (for cold symptoms).   Yes [provider]    Current Facility-Administered Medications  Medication Dose Route Frequency Provider Last Rate Last Dose  . 0.9 %  sodium chloride infusion (Manually program via Guardrails IV Fluids)   Intravenous Once Mannam, Praveen, MD      . 0.9 %  sodium chloride infusion   Intravenous Continuous Dhungel, Nishant, MD      . 0.9 % NaCl with KCl 20 mEq/ L  infusion   Intravenous Continuous Emokpae, Ejiroghene E, MD 75 mL/hr at 08/14/2019 0408    . acetaminophen (TYLENOL) tablet 650 mg  650 mg Oral Q6H PRN Emokpae, Ejiroghene E, MD      . azithromycin (ZITHROMAX) 500 mg in sodium chloride 0.9 % 250 mL IVPB  500 mg Intravenous Q24H Emokpae, Ejiroghene E, MD      . calcium gluconate 1 g/ 50 mL sodium chloride IVPB  1 g Intravenous Once Minor, Vilinda Blanks, NP      . cefTRIAXone (ROCEPHIN) 1 g in sodium chloride 0.9 % 100 mL IVPB  1 g Intravenous Q24H Emokpae, Ejiroghene E, MD      . Chlorhexidine Gluconate Cloth 2 % PADS 6 each  6 each Topical Q0600 Lorin Glass, MD      . EPINEPHrine (ADRENALIN) 1 MG/10ML injection           . fentaNYL (SUBLIMAZE) bolus via infusion 50 mcg  50 mcg Intravenous Q15 min PRN Mannam, Praveen, MD      . fentaNYL (SUBLIMAZE) injection 50 mcg  50 mcg Intravenous Once Chilton Greathouse, MD      .  fentaNYL 2562mcg in NS 260mL (53mcg/ml) infusion-PREMIX  50-200 mcg/hr Intravenous Continuous Mannam, Praveen, MD      . folic acid (FOLVITE) tablet 1 mg  1 mg Oral Daily Emokpae, Ejiroghene E, MD      . guaiFENesin-dextromethorphan (ROBITUSSIN DM) 100-10 MG/5ML syrup 5 mL  5 mL Oral Q4H PRN Emokpae, Ejiroghene E, MD   5 mL at Aug 10, 2019 0628  . levETIRAcetam (KEPPRA) IVPB 500 mg/100 mL premix  500 mg Intravenous Q12H Emokpae, Ejiroghene E, MD      . LORazepam (ATIVAN) tablet 1 mg  1 mg Oral Q6H PRN Emokpae, Ejiroghene E, MD   1 mg at Aug 10, 2019 0603   Or  . LORazepam (ATIVAN) injection 1 mg  1 mg Intravenous Q6H PRN Emokpae, Ejiroghene E, MD      . LORazepam (ATIVAN) injection 1 mg   1 mg Intravenous Q5 min PRN Emokpae, Ejiroghene E, MD      . midazolam (VERSED) 50 mg/50 mL (1 mg/mL) premix infusion  0-10 mg/hr Intravenous Continuous Mannam, Praveen, MD      . midazolam (VERSED) bolus via infusion 1-2 mg  1-2 mg Intravenous Q2H PRN Mannam, Praveen, MD      . multivitamin with minerals tablet 1 tablet  1 tablet Oral Daily Emokpae, Ejiroghene E, MD      . norepinephrine (LEVOPHED) 16 mg in 265mL premix infusion  0-40 mcg/min Intravenous Titrated Dhungel, Nishant, MD      . octreotide (SANDOSTATIN) 2 mcg/mL load via infusion 50 mcg  50 mcg Intravenous Once Dhungel, Nishant, MD       And  . octreotide (SANDOSTATIN) 500 mcg in sodium chloride 0.9 % 250 mL (2 mcg/mL) infusion  25-50 mcg/hr Intravenous Continuous Dhungel, Nishant, MD 12.5 mL/hr at 2019/08/10 0859 25 mcg/hr at 08-10-19 0859  . ondansetron (ZOFRAN) tablet 4 mg  4 mg Oral Q6H PRN Emokpae, Ejiroghene E, MD       Or  . ondansetron (ZOFRAN) injection 4 mg  4 mg Intravenous Q6H PRN Emokpae, Ejiroghene E, MD   4 mg at 10-Aug-2019 7078  . pantoprazole (PROTONIX) 80 mg in sodium chloride 0.9 % 250 mL (0.32 mg/mL) infusion  8 mg/hr Intravenous Continuous Dhungel, Nishant, MD 25 mL/hr at 2019-08-10 0853 8 mg/hr at 2019-08-10 0853  . [START ON 07/19/2019] pantoprazole (PROTONIX) injection 40 mg  40 mg Intravenous Q12H Dhungel, Nishant, MD      . polyethylene glycol (MIRALAX / GLYCOLAX) packet 17 g  17 g Oral Daily PRN Emokpae, Ejiroghene E, MD      . sodium bicarbonate 1 mEq/mL injection           . sodium bicarbonate 1 mEq/mL injection           . sodium bicarbonate 150 mEq in dextrose 5 % 1,000 mL infusion   Intravenous Continuous Minor, Grace Bushy, NP      . thiamine (VITAMIN B-1) tablet 100 mg  100 mg Oral Daily Emokpae, Ejiroghene E, MD   100 mg at 06/25/2019 1711   Or  . thiamine (B-1) injection 100 mg  100 mg Intravenous Daily Emokpae, Ejiroghene E, MD        Allergies as of 07/20/2019  . (No Known Allergies)    History  reviewed. No pertinent family history.  Social History   Socioeconomic History  . Marital status: Married    Spouse name: Not on file  . Number of children: Not on file  . Years of education: Not on file  .  Highest education level: Not on file  Occupational History  . Not on file  Social Needs  . Financial resource strain: Not on file  . Food insecurity    Worry: Not on file    Inability: Not on file  . Transportation needs    Medical: Not on file    Non-medical: Not on file  Tobacco Use  . Smoking status: Current Every Day Smoker    Packs/day: 0.50    Years: 10.00    Pack years: 5.00    Types: Cigarettes  . Smokeless tobacco: Never Used  Substance and Sexual Activity  . Alcohol use: Yes    Comment: daily  . Drug use: No  . Sexual activity: Yes  Lifestyle  . Physical activity    Days per week: Not on file    Minutes per session: Not on file  . Stress: Not on file  Relationships  . Social Musicianconnections    Talks on phone: Not on file    Gets together: Not on file    Attends religious service: Not on file    Active member of club or organization: Not on file    Attends meetings of clubs or organizations: Not on file    Relationship status: Not on file  . Intimate partner violence    Fear of current or ex partner: Not on file    Emotionally abused: Not on file    Physically abused: Not on file    Forced sexual activity: Not on file  Other Topics Concern  . Not on file  Social History Narrative  . Not on file    Review of Systems: see HPI Physical Exam: Vital signs in last 24 hours: Temp:  [98.3 F (36.8 C)-102.5 F (39.2 C)] 102.5 F (39.2 C) (07/22 1230) Pulse Rate:  [93-160] 160 (07/22 1200) Resp:  [22-41] 30 (07/22 1230) BP: (87-141)/(61-113) 98/66 (07/22 1230) SpO2:  [40 %-100 %] 100 % (07/22 1200) Arterial Line BP: (94-165)/(40-73) 94/40 (07/22 1230) FiO2 (%):  [100 %] 100 % (07/22 1128) Weight:  [87 kg] 87 kg (07/21 1900)   Pt ventilated.  Blood  on face.  Intake/Output from previous day: 07/21 0701 - 07/22 0700 In: 1100 [IV Piggyback:1100] Out: -  Intake/Output this shift: Total I/O In: 136.5 [I.V.:116.5; Blood:20] Out: -   Lab Results: Recent Labs    2019-05-17 1129 06/27/2019 0144 06/26/2019 0857 07/18/2019 1041 07/13/2019 1139  WBC 16.3* 14.1* 26.2*  --   --   HGB 11.3* 10.8* 8.2* 9.2* 6.1*  HCT 33.2* 33.0* 26.0* 27.0* 18.0*  PLT 99* 85* 143*  --   --    BMET Recent Labs    2019-05-17 1129 07/11/2019 0144 07/05/2019 0857 07/17/2019 1041 07/17/2019 1139  NA 127* 132* 131* 138 139  K 3.4* 3.7 4.1 4.1 5.1  CL 97* 106 107  --   --   CO2 17* 17* 16*  --   --   GLUCOSE 112* 89 103*  --   --   BUN 5* 6 7  --   --   CREATININE 0.55* 0.38* 0.72  --   --   CALCIUM 7.5* 7.1* 6.9*  --   --    LFT Recent Labs    2019-05-17 1129  07/05/2019 0857  PROT 8.9*   < > 6.5  ALBUMIN 1.7*   < > 1.1*  AST 472*   < > 289*  ALT 103*   < > 70*  ALKPHOS 529*   < >  315*  BILITOT 4.3*   < > 5.6*  BILIDIR 2.5*  --   --   IBILI 1.8*  --   --    < > = values in this interval not displayed.   PT/INR Recent Labs    06/30/2019 0857  LABPROT 19.6*  INR 1.7*    Studies/Results: Ct Head Wo Contrast  Result Date: 2019-09-07 CLINICAL DATA:  Seizure this morning. EXAM: CT HEAD WITHOUT CONTRAST TECHNIQUE: Contiguous axial images were obtained from the base of the skull through the vertex without intravenous contrast. COMPARISON:  None. FINDINGS: Brain: Small cortical calcification in the left frontoparietal region and small cortical calcification in the right parieto-occipital region. Otherwise, normal appearing cerebral hemispheres and posterior fossa structures. Normal size and position of the ventricles. No intracranial hemorrhage, mass lesion or CT evidence of acute infarction. Vascular: No hyperdense vessel or unexpected calcification. Skull: Normal. Negative for fracture or focal lesion. Sinuses/Orbits: Unremarkable. Other: None. IMPRESSION: 1. No  acute abnormality. 2. Two small brain cortical calcifications, as described above. These have benign features, possibly related to previous infection such as cysticercosis or previous granulomatous infection. Electronically Signed   By: Beckie SaltsSteven  Reid M.D.   On: 02020-09-13 16:10   Ct Abdomen Pelvis W Contrast  Result Date: 2019-09-07 CLINICAL DATA:  Acute, generalized abdominal pain, primarily in the lower abdomen with nausea, vomiting and diarrhea since last night. EXAM: CT ABDOMEN AND PELVIS WITH CONTRAST TECHNIQUE: Multidetector CT imaging of the abdomen and pelvis was performed using the standard protocol following bolus administration of intravenous contrast. CONTRAST:  100mL OMNIPAQUE IOHEXOL 300 MG/ML  SOLN COMPARISON:  None. FINDINGS: Lower chest: Large number of small, focal and confluent patchy opacities at both lung bases, involving both lower lobes, right middle lobe and lingula. No pleural fluid. Hepatobiliary: Diffuse low density of the liver relative to the spleen. Enlarged lateral segment left lobe and mildly enlarged caudate lobe and mildly irregular liver contours. Pancreas: Unremarkable. No pancreatic ductal dilatation or surrounding inflammatory changes. Spleen: Enlarged, measuring 16.2 cm in length. Adrenals/Urinary Tract: Adrenal glands are unremarkable. Kidneys are normal, without renal calculi, focal lesion, or hydronephrosis. Bladder is unremarkable. Stomach/Bowel: Small to moderate-sized hiatal hernia. Moderate diffuse, low density proximal gastric wall thickening. Mild diffuse small bowel wall thickening and enhancement. There is also some diffuse low density wall thickening involving the right colon, distal sigmoid colon and proximal rectum. Normal appearing appendix. Vascular/Lymphatic: No significant vascular findings are present. No enlarged abdominal or pelvic lymph nodes. There are mildly prominent lymph nodes at the root of the mesentery without pathological enlargement.  Reproductive: Prostate is unremarkable. Other: Small amount of free peritoneal fluid in the abdomen and small to moderate amount of free peritoneal fluid in the pelvis. Small right inguinal hernia containing fat. Musculoskeletal: Mild lumbar and lower thoracic spine degenerative changes. IMPRESSION: 1. Extensive focal and confluent patchy opacities at both lung bases, compatible with an infectious process. Differential considerations include multilobar pneumonia, septic emboli and marked changes due to viral pneumonitis. 2. Bowel wall abnormalities involving the stomach, small bowel, colon and proximal rectum, compatible with extensive changes of gastroenteritis. 3. Small to moderate amount of free peritoneal fluid in the abdomen and pelvis. 4. Diffuse hepatic steatosis and changes of cirrhosis with associated splenomegaly. 5. Small to moderate-sized hiatal hernia. Electronically Signed   By: Beckie SaltsSteven  Reid M.D.   On: 02020-09-13 16:02   Portable Chest X-ray  Result Date: 06/27/2019 CLINICAL DATA:  Evaluate ETT placement EXAM: PORTABLE CHEST 1 VIEW COMPARISON:  July 15, 2019 FINDINGS: The ETT terminates in good position. No pneumothorax. Increasing bilateral diffuse pulmonary infiltrates, left greater than right. No other changes. IMPRESSION: 1. The ETT is in good position. 2. Worsening bilateral pulmonary infiltrates, left greater than right. Electronically Signed   By: Gerome Samavid  Williams III M.D   On: 10-19-2019 12:02   Dg Chest Portable 1 View  Result Date: 07/09/2019 CLINICAL DATA:  Shortness of breath, rapid heart rate, seizure today. EXAM: PORTABLE CHEST 1 VIEW COMPARISON:  Chest x-ray dated 10/13/2013. FINDINGS: Heart size is upper normal. Patchy bilateral airspace opacities, mid and lower lobe predominant. No pleural effusion or pneumothorax seen. Osseous structures about the chest are unremarkable. IMPRESSION: Patchy bilateral airspace opacities, mid and lower lobe predominant, compatible with multifocal  pneumonia versus pulmonary edema. Electronically Signed   By: Bary RichardStan  Maynard M.D.   On: 07/02/2019 12:25    Impression: Hemodynamic instability, multifactorial, w/ evid of both pneumonia and bleeding (of presumed variceal origin).  Plan: On standby to do egd if/when pt stabilizes and ICU team and pt's wife are still desirous of proceeding.   LOS: 1 day   Katy FitchRobert V Adalind Weitz  2019-11-24, 12:53 PM   Pager [phone] If no answer or after 5 PM call [phone]

## 2019-07-26 NOTE — Progress Notes (Signed)
Mechanical ventilation discontinued due to pt being pronounced deceased. Tube was left in place in case of ME case.

## 2019-07-26 NOTE — ED Notes (Signed)
RN informed hospitalist concerns about pt's vital signs. Dr Maudie Mercury aware and will look at pt chart

## 2019-07-26 NOTE — Progress Notes (Addendum)
While waiting on ICU bed placement, pt oxygen dropped to 80s and placed on non rebreather.  Sustained at 84% on 15L non rebreather.  Pt in and out of consciousness and altered mentally. Rapid team and CCM notified of decomp.  And patient placement called requesting stat placement d/t patient's condition.

## 2019-07-26 NOTE — Progress Notes (Signed)
Assumed care of patient approx 1100, patient continued to be unstable requiring 2:1 nursing care with additional assistance outside of room managing patient's critical care medication drips and blood transfusions.  Beside EGD attempted to stabilize patient due to upper GI bleed however unsuccessful. Patient pronounced deceased by Dr. Vaughan Browner due to medical futility at 1345.   Family visited shortly after, spiritual care provided by chaplain.

## 2019-07-26 NOTE — ED Notes (Signed)
Pt given water and ginger ale.

## 2019-07-26 NOTE — ED Notes (Signed)
[facility] arrived to transport patient  

## 2019-07-26 NOTE — Progress Notes (Signed)
Responded to page for death in 05-09-23. Family present at bedside. Nurse stated pt passed at 1359. Wife requested prayer. I offered spiritual care with ministry of presence, words of comfort and prayer. I gave them some personal space for mourning. 15 minutes later,  Upon my return I read sacred text and words of encouragement. I gave wife patient placement card and told her to call number when she was ready to share requests for pt. Family was grateful for the care and compassion of Nursing staff and Chaplain in this difficult time for the family.  Wife gave me contact information and I gave it to Retail buyer.  Chaplain Fidel Levy [phone]

## 2019-07-26 NOTE — Progress Notes (Addendum)
After RN finished with report and learned pt had been coughing up moderate amount of blood clots, heart rate sustained 140s-150s despite bolus overnight, CIWA scoring high at 9 despite Ativan given overnight, and worsening oxygen demands 2L [place] to 7L [place] high flow...RN called rapid team & notified day MD of deterioration.  Orders given and critical care physician called for consult.

## 2019-07-26 NOTE — Consult Note (Addendum)
NAME:  Adam Sanchez, MRN:  [id number], DOB:  June 30, 1976, LOS: 1 ADMISSION DATE:  2019/10/10, CONSULTATION DATE:  07/23/2019 REFERRING MD:N Dhungel MD, CHIEF COMPLAINT:  PNA  Brief History   43 year old with alcohol cirrhosis, portal pulmonary hypertension with varices, seizures admitted with multifocal pneumonia COVID testing negative.  Overnight patient had several episodes of hematemesis.  PCCM called on 7/22 for tachycardia, increasing hypoxia  Past Medical History  Alcoholism, cirrhosis, esophageal varices, seizures Drinks a sixpack of beer every day.  Significant Hospital Events   7/21 Admit and [facility] 7/22 Hematemesis, hypoxia, tachycarda. PCCM consult  Consults:  PCCM, GI  Procedures:    Significant Diagnostic Tests:  CT abdomen pelvis 7/21- bilateral patchy opacities at lung bases.  Extensive changes of gastroenteritis.  Small amount of peritoneal fluid.  Cirrhotic changes.  Small-to-moderate hiatal hernia.  CT head 7/21-no acute abnormality.  Small cortical calcifications.  Micro Data:  Blood culture 7/21 COVID-19 7/21-negative COVID-19 [sendout] 7/21 - pending  Antimicrobials:  Ceftriaxone 7/22>> Zithromax 7/22 >>  Interim history/subjective:    Objective   Blood pressure 133/77, pulse (!) 135, temperature 99.3 F (37.4 C), temperature source Oral, resp. rate (!) 30, weight 87 kg, SpO2 93 %.        Intake/Output Summary (Last 24 hours) at 07/21/2019 [id number] Last data filed at 2019/10/10 1643 Gross per 24 hour  Intake 1100 ml  Output -  Net 1100 ml   Filed Weights   2019/06/04 1900  Weight: 87 kg    Examination: Gen:      Moderate distress HEENT:  EOMI, sclera anicteric Neck:     No masses; no thyromegaly Lungs:    Clear to auscultation bilaterally; normal respiratory effort CV:         Regular rate and rhythm; no murmurs Abd:      + bowel sounds; soft, non-tender; no palpable masses, no distension Ext:    No edema; adequate peripheral  perfusion Skin:      Warm and dry; no rash Neuro: alert and oriented x 3 Psych: normal mood and affect  Resolved Hospital Problem list     Assessment & Plan:  43 year old therapy, alcoholic with EtOH withdrawal, multifocal pneumonia, GI bleed  Respiratory failure, Pneumonia Imaging reviewed with multifocal infiltrates Repeat COVID is pending Maintain airborne precautions Continue antibiotics Transfer to ICU.  High risk for needing intubation.  GI bleed, H/O varices Gastroenteritis on CT scan Protonix, octreotide drip GI already consulted by primary care Follow CBC  EtOH withdrawal Continue CIWA protocol.   Tachycardia, hypotension Likely combination of sepsis and dehydration Bolus lactated Ringer Follow-up lactic acid  Elevated troponin.  Likely demand Sinus tachycardia EKG with no acute ST changes Monitoring, IV fluid hydration Check echo, follow troponin, EKG  Seizures Likely Etoh related Continue keppra Check EEG  Best practice:  Diet: NPO Pain/Anxiety/Delirium protocol (if indicated): NA VAP protocol (if indicated): NA DVT prophylaxis: SCDs GI prophylaxis: protonix drip Glucose control: Monitor Mobility: Bed Code Status: Full Family Communication: patient updated Disposition: transfer to ICU  Labs   CBC: Recent Labs  Lab 2019/06/04 1129 07/20/2019 0144  WBC 16.3* 14.1*  NEUTROABS 14.2* 11.8*  HGB 11.3* 10.8*  HCT 33.2* 33.0*  MCV 98.8 102.8*  PLT 99* 85*    Basic Metabolic Panel: Recent Labs  Lab 2019/06/04 1129 2019/06/04 1850 07/24/2019 0144  NA 127*  --  132*  K 3.4*  --  3.7  CL 97*  --  106  CO2 17*  --  17*  GLUCOSE 112*  --  89  BUN 5*  --  6  CREATININE 0.55*  --  0.38*  CALCIUM 7.5*  --  7.1*  MG  --  1.6*  --   PHOS  --  3.0  --    GFR: CrCl cannot be calculated (Unknown ideal weight.). Recent Labs  Lab 07/01/2019 1129 07/01/2019 1406 07/09/2019 1547 07/03/2019 2051 07/28/19 0144  PROCALCITON 1.03  --   --   --   --   WBC  16.3*  --   --   --  14.1*  LATICACIDVEN  --  2.7* 2.4* 2.1*  --     Liver Function Tests: Recent Labs  Lab 07/02/2019 1129 Jul 28, 2019 0144  AST 472* 357*  ALT 103* 83*  ALKPHOS 529* 408*  BILITOT 4.3* 6.0*  PROT 8.9* 7.5  ALBUMIN 1.7* 1.5*   No results for input(s): LIPASE, AMYLASE in the last 168 hours. No results for input(s): AMMONIA in the last 168 hours.  ABG    Component Value Date/Time   PHART 7.489 (H) July 28, 2019 0744   PCO2ART 24.8 (L) 07-28-19 0744   PO2ART 70.0 (L) 2019/07/28 0744   HCO3 18.6 (L) Jul 28, 2019 0744   ACIDBASEDEF 4.2 (H) 07/28/19 0744   O2SAT 94.2 2019-07-28 0744     Coagulation Profile: No results for input(s): INR, PROTIME in the last 168 hours.  Cardiac Enzymes: No results for input(s): CKTOTAL, CKMB, CKMBINDEX, TROPONINI in the last 168 hours.  HbA1C: Hgb A1c MFr Bld  Date/Time Value Ref Range Status  10/12/2013 01:03 AM 5.7 (H) <5.7 % Final    Comment:    (NOTE)                                                                       According to the ADA Clinical Practice Recommendations for 2011, when HbA1c is used as a screening test:  >=6.5%   Diagnostic of Diabetes Mellitus           (if abnormal result is confirmed) 5.7-6.4%   Increased risk of developing Diabetes Mellitus References:Diagnosis and Classification of Diabetes Mellitus,Diabetes HALP,3790,24(OXBDZ 1):S62-S69 and Standards of Medical Care in         Diabetes - 2011,Diabetes HGDJ,2426,83 (Suppl 1):S11-S61.    CBG: No results for input(s): GLUCAP in the last 168 hours.  Review of Systems:   REVIEW OF SYSTEMS:   All negative; except for those that are bolded, which indicate positives.  Constitutional: weight loss, weight gain, night sweats, fevers, chills, fatigue, weakness.  HEENT: headaches, sore throat, sneezing, nasal congestion, post nasal drip, difficulty swallowing, tooth/dental problems, visual complaints, visual changes, ear aches. Neuro: difficulty with  speech, weakness, numbness, ataxia. CV:  chest pain, orthopnea, PND, swelling in lower extremities, dizziness, palpitations, syncope.  Resp: cough, hemoptysis, dyspnea, wheezing. GI: heartburn, indigestion, abdominal pain, nausea, vomiting, diarrhea, constipation, change in bowel habits, loss of appetite, hematemesis, melena, hematochezia.  GU: dysuria, change in color of urine, urgency or frequency, flank pain, hematuria. MSK: joint pain or swelling, decreased range of motion. Psych: change in mood or affect, depression, anxiety, suicidal ideations, homicidal ideations. Skin: rash, itching, bruising.  Past Medical History  He,  has a past medical history of Alcoholism ([place]), Esophageal  varices (HCC), and Seizures (HCC).   Surgical History    Past Surgical History:  Procedure Laterality Date  . ESOPHAGOGASTRODUODENOSCOPY N/A 10/12/2013   Procedure: ESOPHAGOGASTRODUODENOSCOPY (EGD);  Surgeon: Shirley FriarVincent C. Schooler, MD;  [facility];  Service: Endoscopy;  Laterality: N/A;     Social History   reports that he has been smoking cigarettes. He has a 5.00 pack-year smoking history. He has never used smokeless tobacco. He reports current alcohol use. He reports that he does not use drugs.   Family History   His family history is not on file.   Allergies No Known Allergies   Home Medications  Prior to Admission medications   Medication Sig Start Date End Date Taking? Authorizing Provider  acetaminophen (TYLENOL) 500 MG tablet Take 500 mg by mouth every 6 (six) hours as needed for mild pain.   Yes [provider]  diphenhydrAMINE (BENADRYL) 25 MG tablet Take 25 mg by mouth every 6 (six) hours as needed for allergies.   Yes [provider]  Pseudoeph-Doxylamine-DM-APAP (NYQUIL PO) Take 15-30 mLs by mouth daily as needed (for cold symptoms).   Yes [provider]    The patient is critically ill with multiple organ system failure and requires high  complexity decision making for assessment and support, frequent evaluation and titration of therapies, advanced monitoring, review of radiographic studies and interpretation of complex data.   Critical Care Time devoted to patient care services, exclusive of separately billable procedures, described in this note is 35 minutes.   Chilton GreathousePraveen Maryella Abood MD [facility] Pager [phone] If no answer call [phone] 07/05/2019, 8:51 AM

## 2019-07-26 NOTE — Code Documentation (Addendum)
PCCM Code note  Pt went into bradycardia, PEA arrest after intubation Given 2 pushes of epi and 1 round of bicarb. ROSC in 3-4 mins Not a hypothermia candidate due to active GI bleed and hemodynamic instability.  Called wife and updated her over the telephone. She is tearful and just got out of surgery for [facility], lost twins in utero recently. She has requested [place] to call her sister Jeannetta Nap if she is unable to reached.  Critical Care Time devoted to patient care services, exclusive of separately billable procedures, described in this note is 35 minutes.   Marshell Garfinkel MD  Pulmonary and Critical Care Pager [phone] If no answer call [phone] 06/29/2019, 10:19 AM

## 2019-07-26 NOTE — Op Note (Addendum)
[facility] Patient Name: Adam Sanchez Procedure Date : 05/19/19 MRN: [id number] Attending MD: Bernette Redbirdobert Emiel Kielty , MD Date of Birth: 12/28/1975 CSN: [id number] Age: 43 Admit Type: Inpatient Procedure:                Upper GI endoscopy Indications:              Hematemesis--patient with known varices and recent                            resumption of ethanol consumption presented with                            shortness of breath, possible pneumonia, and                            progressive hypotension with recurrent hematemesis. Providers:                Bernette Redbirdobert Celisa Schoenberg, MD, Glory RosebushJulie Gibson, RN, Kandice RobinsonsGuillaume                            [place], Technician Referring MD:              Medicines:                None (patient intubated) Complications:            No immediate procedural complications, although the                            patient became progressively more hemodynamically                            unstable during the course of the procedure. Estimated Blood Loss:     Estimated blood loss: none from procedure. Procedure:                Pre-Anesthesia Assessment:                           - Prior to the procedure, a History and Physical                            was performed, and patient medications and                            allergies were reviewed. The patient's tolerance of                            previous anesthesia was also reviewed. The risks                            and benefits of the procedure and the sedation                            options and risks were discussed with the patient.  All questions were answered, and informed consent                            was obtained. Prior Anticoagulants: The patient has                            taken no previous anticoagulant or antiplatelet                            agents. ASA Grade Assessment: V - A moribund                            patient who is not expected to  survive without the                            operation. After reviewing the risks and benefits,                            the patient was deemed in satisfactory condition to                            undergo the procedure.                           After obtaining informed consent, the endoscope was                            passed under direct vision. Throughout the                            procedure, the patient's blood pressure, pulse, and                            oxygen saturations were monitored continuously. The                            GIF-H190 ([id number] Olympus gastroscope was                            introduced through the mouth, and advanced to the                            second part of duodenum. The upper GI endoscopy was                            technically difficult and complex due to poor                            endoscopic visualization from large amount of blood                            present. The patient tolerated the procedure poorly  due to the patient's progressive cardiovascular                            instability (hypotension). The patient had coded                            for the third time just prior to this procedure but                            ROSC was restored thereafter, allowing [place] to                            proceed. Scope In: Scope Out: Findings:      Clotted blood was found in the entire esophagus. Some of the clots were       extracted, but additional gelatinous burgundy clotted clumps of blood       were regurgitated out the patient's mouth and nose during the procedure.      There is no endoscopic evidence of acitve bleeding (fresh bright red       blood) or obvious varices in the entire esophagus.      Clotted blood was found in the entire examined stomach, precluding       visualization and in particular preventing visualization of the cardia       to check for gastric varices. Again, as in  the esophagus, there was no       evident fresh red blood or active bleeding identified.      No gross lesions were noted in the first portion of the duodenum.      As the procedure went on, the patient's heart rate was gradually       declining and it was felt that he was about to code again, and that       further attempts to identify or remedy a bleeding site would not likely       be successful, so the scope was removed and the procedure terminated. Impression:               - Clotted blood in the esophagus.                           - Clotted blood in the entire stomach.                           - No gross lesions in the first portion of the                            duodenum.                           - No specimens collected.                           - No specific source for the patient's massive UGI                            hemorrhage could be identified, nor could any  intervention be undertaken. Procedure terminated                            due to progressive hemodynamic instability. Recommendation:           - Observe patient's clinical course. Prognosis at                            conclusion of procedure was ominous, and he is                            currently deemed not to have an endoscopically                            remediable source of bleeding. Procedure Code(s):        --- Professional ---                           [phone], Esophagogastroduodenoscopy, flexible,                            transoral; diagnostic, including collection of                            specimen(s) by brushing or washing, when performed                            (separate procedure) Diagnosis Code(s):        --- Professional ---                           K92.0, Hematemesis                           K92.2, Gastrointestinal hemorrhage, unspecified CPT copyright 2019 American Medical Association. All rights reserved. The codes documented in this report are  preliminary and upon coder review may  be revised to meet current compliance requirements. Bernette Redbirdobert Leasha Goldberger, MD 07/14/2019 8:45:28 PM This report has been signed electronically. Number of Addenda: 0

## 2019-07-26 NOTE — Procedures (Signed)
Intubation Procedure Note Adam Sanchez [id number] 03-10-1976  Procedure: Intubation Indications: Respiratory insufficiency  Procedure Details Consent: Unable to obtain consent because of emergent medical necessity. Time Out: Verified patient identification, verified procedure, site/side was marked, verified correct patient position, special equipment/implants available, medications/allergies/relevent history reviewed, required imaging and test results available.  Performed  Maximum sterile technique was used including cap, gloves, gown, hand hygiene, mask and sheet.  Glide scope, blade 3, 1 attempt Significant bleeding noted in the oropharynx  Evaluation Hemodynamic Status: Persistent hypotension treated with pressors; O2 sats: transiently fell during during procedure Patient's Current Condition: stable Complications: Pt coded briefly after intubation requiring 2 pushes of epi and 1 round of bicarb. ROSC in 3-4 mins Patient did not tolerate procedure well. Chest X-ray ordered to verify placement.  CXR: pending.  Adam Sanchez 2019-08-11

## 2019-07-26 NOTE — Significant Event (Signed)
Rapid Response Event Note  Overview:  RN called for a second set of eyes during her initial assessment of pt. Sats dropping to 80's. Advised to call MD and I would be over. RN reports pt is covid +    Initial Focused Assessment: On arrival pt's [place] on top of forehead, we had to keep reminding him to leave [place] on.  MD at bedside, pt following commands, answering questions appropriately. HR 150's, Sats 97% 7L Echo   Interventions: Continue to monitor for now. MD to consult with PCCM  [phone] pt to transfer to ICU Pt received bed on 30M 0920  Event Summary:   at      at          [facility], [facility]

## 2019-07-26 NOTE — Discharge Summary (Signed)
Physician Death Summary  Patient ID: [name] MRN: [id number] DOB/AGE: Mar 31, 1976 43 y.o.  Admit date: 01-Aug-2019 Discharge date: 07/22/2019   Admission Diagnoses: Respiratory failure, pneumonia  Discharge Diagnoses: Hemorrhagic shock GI bleed Alcohol cirrhosis Aspiration pneumonia Respiratory failure  Discharged Condition: Deceased  Hospital Course:   43 year old with alcohol cirrhosis, portal pulmonary hypertension with varices, seizures admitted with multifocal pneumonia. COVID testing negative x 2.  On night of admission patient had several episodes of hematemesis. PCCM called on 7/22 for tachycardia, increasing hypoxia.  Patient transferred to the ICU and emergently intubated for worsening respiratory failure, shock.  Noted to have significant bleeding in the oropharynx suggestive of ongoing GI bleed. GI consulted who did an emergent scope.  No esophageal varices found.  Noted a lot of blood in the stomach with no clear source identified.  GI bleed was not amenable endoscopic intervention  Patient continued to deteriorate and went into cardiac arrest 3 times through the day.  He was unable to be revived in spite of blood transfusions and aggressive resuscitation.  He passed away at 1:45 PM.  Wife was called informed over the telephone.  She was able to come in to visit   Consults: Gastroenterology  Signed: Marshell Garfinkel 07/08/2019, 2:48 PM

## 2019-07-26 NOTE — Progress Notes (Signed)
PROGRESS NOTE                                                                                                                                                                                                             Patient Demographics:    Adam Sanchez, is a 43 y.o. male, DOB - 03-24-1976, XBJ:[id number]  Admit date - 07-31-19   Admitting Physician Ejiroghene Wendall StadeE Emokpae, MD  Outpatient Primary MD for the patient is Dettinger, Elige RadonJoshua A, MD  LOS - 1  Outpatient Specialists: None  Chief Complaint  Patient presents with   Shortness of Breath   Seizures    0300       Brief Narrative   43 year old Hispanic male with history of alcoholic liver cirrhosis with esophageal varices (EGD with banding in 2014) and seizures (not on meds).  Patient recently went back to drinking alcohol again per his wife.  He presented to any pain ED with multiple symptoms of 1 day duration including increasing shortness of breath, palpitations, cough, generalized body aches, lower abdominal pain with nausea and loose stools.  He reported left-sided nonradiating chest pain and also an episode of witnessed seizure.  Last alcohol use was 2 days prior to admission.  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  Patient continues to smoke half a pack of cigarettes daily.  In the ED he was septic with fever of 102.3 F, tachycardic to 140s, tachypneic to 41, low systolic blood pressure of 92 mmHg and O2 sat of 90% on room air improved with nasal cannula.  WBC of 16 point 3K, lactic acid of 2.7 and elevated high-sensitivity troponin of 1692.  BNP was elevated to 548, sodium of 127, platelets of 99, significant transaminitis with AST of 472, ALT of 103 and ALP of 529, total bili of 4.3 and low albumin 1.7.  Chest x-ray with patchy bilateral airspace opacity suggestive of multifocal pneumonia.  CT of the abdomen pelvis showed extensive focal and confluent  patchy opacity of bilateral lung bases.  Patient placed on empiric IV Rocephin and azithromycin, given 2 L normal saline bolus and [facility] for management of severe sepsis with septic shock with multifocal pneumonia.  COVID-19 tested in the ED was negative.  This morning patient was requiring 7 L high flow via nasal cannula and persistently tachycardic  in the 150s.  Complains of off-and-on chest pain and abdominal pain. He had 2 episodes of moderate amount of bright red hematemesis.  ABG done showed hypoxemic respiratory failure.  PCCM and GI consulted.     Subjective:   Speaks little [place].  Reports shortness of breath and left-sided chest discomfort with right upper quadrant pain.  Had 2 episodes of moderate bright red vomiting.  Assessment  & Plan :    Principal Problem: Severe sepsis with septic shock.  (HCC) Acute respiratory failure with hypoxemia (HCC)  multifocal pneumonia seen on imaging.  Repeat COVID 19 is pending.  Continue airborne precaution and empiric antibiotic.  Patient is at high risk of intubation and being transferred to ICU.  PCCM consulted. Noted for elevated LDH and lactic acid.  Also significantly elevated d-dimer (>20).  Procalcitonin on admission was negative.  Follow repeat lactic acid and procalcitonin.  Active Problems: Acute upper GI bleed Secondary to variceal bleed versus alcoholic gastritis.  History of bleeding esophageal varix with banding in 2014.  Hemoglobin actively dropping (8 this morning, baseline 13).Marland Kitchen  Keep n.p.o.  Placed on octreotide drip and IV Protonix drip.  Serial H&H. Spoke with Dr. Matthias Hughs [facility] GI) who will see patient.  Hypotension with tachycardia Secondary to sepsis and GI bleed with dehydration. 1 L normal saline bolus given followed by maintenance fluid.  Seizures Noted for witnessed seizure at home.  After discussion with neurologist was placed on empiric IV Keppra.  Monitor on CIWA and PRN Ativan.  Head CT  unremarkable.  EEG pending.  Elevated troponin Suspect demand ischemia with sepsis and sinus tach cardia.  2D echo ordered.  Liver cirrhosis with acute transaminitis Ongoing alcohol use and septic shock.  Monitor LFTs closely.  Follow-up with GI recommendation. Patient counseled on alcohol cessation.   tobacco use Nicotine patch ordered     Code Status : Full code  Family Communication  : None at bedside.  We will update wife on the phone  Disposition Plan  : Transfer to ICU.  Condition guarded.  Barriers For Discharge : Active symptoms  Consults  : PCCM, GI  Procedures  : CT head, CT abdomen pelvis  DVT Prophylaxis  : SCDs  Lab Results  Component Value Date   PLT 143 (L) Aug 15, 2019    Antibiotics  :   Anti-infectives (From admission, onward)   Start     Dose/Rate Route Frequency Ordered Stop   August 15, 2019 1000  azithromycin (ZITHROMAX) 500 mg in sodium chloride 0.9 % 250 mL IVPB     500 mg 250 mL/hr over 60 Minutes Intravenous Every 24 hours 08/15/2019 0353     08-15-2019 1000  cefTRIAXone (ROCEPHIN) 1 g in sodium chloride 0.9 % 100 mL IVPB     1 g 200 mL/hr over 30 Minutes Intravenous Every 24 hours Aug 15, 2019 0353     07/08/2019 1300  cefTRIAXone (ROCEPHIN) 1 g in sodium chloride 0.9 % 100 mL IVPB     1 g 200 mL/hr over 30 Minutes Intravenous  Once 07/25/2019 1255 07/19/2019 1418   07/05/2019 1300  azithromycin (ZITHROMAX) tablet 500 mg     500 mg Oral  Once 07/20/2019 1255 07/05/2019 1342        Objective:   Vitals:   August 15, 2019 0233 08-15-19 0400 08/15/19 0542 Aug 15, 2019 0655  BP:  129/67 133/77   Pulse: (!) 128 (!) 132 (!) 135   Resp: (!) 23 (!) 30    Temp:  98.3 F (36.8 C) 99.3 F (  37.4 C)   TempSrc:  Oral Oral   SpO2:  93% 93% 93%  Weight:        Wt Readings from Last 3 Encounters:  07/19/2019 87 kg  09/24/18 86.6 kg  09/06/18 86.3 kg     Intake/Output Summary (Last 24 hours) at March 04, 2019 0917 Last data filed at 07/08/2019 1643 Gross per 24 hour  Intake 1100  ml  Output --  Net 1100 ml     Physical Exam  Gen: In some distress HEENT: Pallor present, icterus + dry mucosa, supple neck Chest: Fine bibasilar crackles CVS: S1-S2 tachycardic, no murmurs rub or gallop GI: Soft, right upper quadrant tenderness, nondistended, Musculoskeletal: Warm, no edema CNS: Alert and oriented, no tremors    Data Review:    CBC Recent Labs  Lab 06/29/2019 1129 09/04/2019 0144 09/04/2019 0857  WBC 16.3* 14.1* 26.2*  HGB 11.3* 10.8* 8.2*  HCT 33.2* 33.0* 26.0*  PLT 99* 85* 143*  MCV 98.8 102.8* 106.6*  MCH 33.6 33.6 33.6  MCHC 34.0 32.7 31.5  RDW 17.2* 17.8* 18.1*  LYMPHSABS 1.0 1.0  --   MONOABS 1.0 1.0  --   EOSABS 0.0 0.1  --   BASOSABS 0.0 0.0  --     Chemistries  Recent Labs  Lab 07/14/2019 1129 07/22/2019 1850 09/04/2019 0144  NA 127*  --  132*  K 3.4*  --  3.7  CL 97*  --  106  CO2 17*  --  17*  GLUCOSE 112*  --  89  BUN 5*  --  6  CREATININE 0.55*  --  0.38*  CALCIUM 7.5*  --  7.1*  MG  --  1.6*  --   AST 472*  --  357*  ALT 103*  --  83*  ALKPHOS 529*  --  408*  BILITOT 4.3*  --  6.0*   ------------------------------------------------------------------------------------------------------------------ No results for input(s): CHOL, HDL, LDLCALC, TRIG, CHOLHDL, LDLDIRECT in the last 72 hours.  Lab Results  Component Value Date   HGBA1C 5.7 (H) 10/12/2013   ------------------------------------------------------------------------------------------------------------------ No results for input(s): TSH, T4TOTAL, T3FREE, THYROIDAB in the last 72 hours.  Invalid input(s): FREET3 ------------------------------------------------------------------------------------------------------------------ Recent Labs    07/08/2019 1129  FERRITIN 1,220*    Coagulation profile No results for input(s): INR, PROTIME in the last 168 hours.  Recent Labs    07/23/2019 1129  DDIMER >20.00*    Cardiac Enzymes No results for input(s): CKMB,  TROPONINI, MYOGLOBIN in the last 168 hours.  Invalid input(s): CK ------------------------------------------------------------------------------------------------------------------    Component Value Date/Time   BNP 548.0 (H) 07/18/2019 1129    Inpatient Medications  Scheduled Meds:  folic acid  1 mg Oral Daily   multivitamin with minerals  1 tablet Oral Daily   octreotide  50 mcg Intravenous Once   [START ON 07/19/2019] pantoprazole  40 mg Intravenous Q12H   thiamine  100 mg Oral Daily   Or   thiamine  100 mg Intravenous Daily   Continuous Infusions:  sodium chloride     0.9 % NaCl with KCl 20 mEq / L 75 mL/hr at 09/04/2019 0408   azithromycin     cefTRIAXone (ROCEPHIN)  IV     levETIRAcetam     octreotide  (SANDOSTATIN)    IV infusion 25 mcg/hr (09/04/2019 0855)   pantoprozole (PROTONIX) infusion 8 mg/hr (09/04/2019 0849)   sodium chloride     PRN Meds:.acetaminophen, guaiFENesin-dextromethorphan, LORazepam **OR** LORazepam, LORazepam, ondansetron **OR** ondansetron (ZOFRAN) IV, polyethylene glycol  Micro Results  Recent Results (from the past 240 hour(s))  SARS Coronavirus 2 (CEPHEID - Performed in [place] hospital lab), Hosp Order     Status: None   Collection Time: 07/18/2019 12:01 PM   Specimen: Nasopharyngeal Swab  Result Value Ref Range Status   SARS Coronavirus 2 NEGATIVE NEGATIVE Final    Comment: (NOTE) If result is NEGATIVE SARS-CoV-2 target nucleic acids are NOT DETECTED. The SARS-CoV-2 RNA is generally detectable in upper and lower  respiratory specimens during the acute phase of infection. The lowest  concentration of SARS-CoV-2 viral copies this assay can detect is 250  copies / mL. A negative result does not preclude SARS-CoV-2 infection  and should not be used as the sole basis for treatment or other  patient management decisions.  A negative result may occur with  improper specimen collection / handling, submission of specimen other  than  nasopharyngeal swab, presence of viral mutation(s) within the  areas targeted by this assay, and inadequate number of viral copies  (<250 copies / mL). A negative result must be combined with clinical  observations, patient history, and epidemiological information. If result is POSITIVE SARS-CoV-2 target nucleic acids are DETECTED. The SARS-CoV-2 RNA is generally detectable in upper and lower  respiratory specimens dur ing the acute phase of infection.  Positive  results are indicative of active infection with SARS-CoV-2.  Clinical  correlation with patient history and other diagnostic information is  necessary to determine patient infection status.  Positive results do  not rule out bacterial infection or co-infection with other viruses. If result is PRESUMPTIVE POSTIVE SARS-CoV-2 nucleic acids MAY BE PRESENT.   A presumptive positive result was obtained on the submitted specimen  and confirmed on repeat testing.  While 2019 novel coronavirus  (SARS-CoV-2) nucleic acids may be present in the submitted sample  additional confirmatory testing may be necessary for epidemiological  and / or clinical management purposes  to differentiate between  SARS-CoV-2 and other Sarbecovirus currently known to infect humans.  If clinically indicated additional testing with an alternate test  methodology [phone]) is advised. The SARS-CoV-2 RNA is generally  detectable in upper and lower respiratory sp ecimens during the acute  phase of infection. The expected result is Negative. Fact Sheet for Patients:  StrictlyIdeas.no Fact Sheet for Healthcare Providers: BankingDealers.co.za This test is not yet approved or cleared by the [place] FDA and has been authorized for detection and/or diagnosis of SARS-CoV-2 by FDA under an Emergency Use Authorization (EUA).  This EUA will remain in effect (meaning this test can be used) for the duration of  the COVID-19 declaration under Section 564(b)(1) of the Act, 21 U.S.C. section 360bbb-3(b)(1), unless the authorization is terminated or revoked sooner. Performed at [facility], [address] [place], [place] [address]   Blood culture (routine x 2)     Status: None (Preliminary result)   Collection Time: 06/25/2019  2:06 PM   Specimen: BLOOD LEFT HAND  Result Value Ref Range Status   Specimen Description BLOOD LEFT HAND  Final   Special Requests   Final    BOTTLES DRAWN AEROBIC AND ANAEROBIC Blood Culture adequate volume   Culture   Final    NO GROWTH < 24 HOURS Performed at [facility], [address], [place], [place] [address]    Report Status PENDING  Incomplete  Blood culture (routine x 2)     Status: None (Preliminary result)   Collection Time: 07/22/2019  2:06 PM   Specimen: BLOOD LEFT HAND  Result Value Ref Range Status   Specimen Description BLOOD LEFT HAND  Final   Special Requests   Final    BOTTLES DRAWN AEROBIC AND ANAEROBIC Blood Culture results may not be optimal due to an inadequate volume of blood received in culture bottles   Culture   Final    NO GROWTH < 24 HOURS Performed at [facility], [address] [place], [place] [address]    Report Status PENDING  Incomplete    Radiology Reports Ct Head Wo Contrast  Result Date: Aug 08, 2019 CLINICAL DATA:  Seizure this morning. EXAM: CT HEAD WITHOUT CONTRAST TECHNIQUE: Contiguous axial images were obtained from the base of the skull through the vertex without intravenous contrast. COMPARISON:  None. FINDINGS: Brain: Small cortical calcification in the left frontoparietal region and small cortical calcification in the right parieto-occipital region. Otherwise, normal appearing cerebral hemispheres and posterior fossa structures. Normal size and position of the ventricles. No intracranial hemorrhage, mass lesion or CT evidence of acute infarction. Vascular: No hyperdense vessel or unexpected calcification. Skull: Normal.  Negative for fracture or focal lesion. Sinuses/Orbits: Unremarkable. Other: None. IMPRESSION: 1. No acute abnormality. 2. Two small brain cortical calcifications, as described above. These have benign features, possibly related to previous infection such as cysticercosis or previous granulomatous infection. Electronically Signed   By: Beckie Salts M.D.   On: 08/08/2019 16:10   Ct Abdomen Pelvis W Contrast  Result Date: 2019-08-08 CLINICAL DATA:  Acute, generalized abdominal pain, primarily in the lower abdomen with nausea, vomiting and diarrhea since last night. EXAM: CT ABDOMEN AND PELVIS WITH CONTRAST TECHNIQUE: Multidetector CT imaging of the abdomen and pelvis was performed using the standard protocol following bolus administration of intravenous contrast. CONTRAST:  OMNIPAQUE IOHEXOL 300 MG/ML  SOLN COMPARISON:  None. FINDINGS: Lower chest: Large number of small, focal and confluent patchy opacities at both lung bases, involving both lower lobes, right middle lobe and lingula. No pleural fluid. Hepatobiliary: Diffuse low density of the liver relative to the spleen. Enlarged lateral segment left lobe and mildly enlarged caudate lobe and mildly irregular liver contours. Pancreas: Unremarkable. No pancreatic ductal dilatation or surrounding inflammatory changes. Spleen: Enlarged, measuring 16.2 cm in length. Adrenals/Urinary Tract: Adrenal glands are unremarkable. Kidneys are normal, without renal calculi, focal lesion, or hydronephrosis. Bladder is unremarkable. Stomach/Bowel: Small to moderate-sized hiatal hernia. Moderate diffuse, low density proximal gastric wall thickening. Mild diffuse small bowel wall thickening and enhancement. There is also some diffuse low density wall thickening involving the right colon, distal sigmoid colon and proximal rectum. Normal appearing appendix. Vascular/Lymphatic: No significant vascular findings are present. No enlarged abdominal or pelvic lymph nodes. There are  mildly prominent lymph nodes at the root of the mesentery without pathological enlargement. Reproductive: Prostate is unremarkable. Other: Small amount of free peritoneal fluid in the abdomen and small to moderate amount of free peritoneal fluid in the pelvis. Small right inguinal hernia containing fat. Musculoskeletal: Mild lumbar and lower thoracic spine degenerative changes. IMPRESSION: 1. Extensive focal and confluent patchy opacities at both lung bases, compatible with an infectious process. Differential considerations include multilobar pneumonia, septic emboli and marked changes due to viral pneumonitis. 2. Bowel wall abnormalities involving the stomach, small bowel, colon and proximal rectum, compatible with extensive changes of gastroenteritis. 3. Small to moderate amount of free peritoneal fluid in the abdomen and pelvis. 4. Diffuse hepatic steatosis and changes of cirrhosis with associated splenomegaly. 5. Small to moderate-sized hiatal hernia. Electronically Signed   By: Beckie Salts  M.D.   On: 10/26/2019 16:02   Dg Chest Portable 1 View  Result Date: 30-May-2019 CLINICAL DATA:  Shortness of breath, rapid heart rate, seizure today. EXAM: PORTABLE CHEST 1 VIEW COMPARISON:  Chest x-ray dated 10/13/2013. FINDINGS: Heart size is upper normal. Patchy bilateral airspace opacities, mid and lower lobe predominant. No pleural effusion or pneumothorax seen. Osseous structures about the chest are unremarkable. IMPRESSION: Patchy bilateral airspace opacities, mid and lower lobe predominant, compatible with multifocal pneumonia versus pulmonary edema. Electronically Signed   By: Bary RichardStan  Maynard M.D.   On: 10/26/2019 12:25    Time Spent in minutes 35   Rubens Cranston M.D on 07/20/2019 at 9:17 AM  Between 7am to 7pm - Pager - [phone]  After 7pm go to [email or web address] - password [email or web address]  [facility] -  Office  [phone]

## 2019-07-26 DEATH — deceased

## 2020-09-11 IMAGING — CT CT ABDOMEN AND PELVIS WITH CONTRAST
2 of 5 series · 15 of 46 positions shown, 17 images · IV contrast (Isovue)
Comparison: None.

CLINICAL DATA: Acute, generalized abdominal pain, primarily in the
lower abdomen with nausea, vomiting and diarrhea since last night.

EXAM:
CT ABDOMEN AND PELVIS WITH CONTRAST
TECHNIQUE: Multidetector CT imaging of the abdomen and pelvis was performed
using the standard protocol following bolus administration of
intravenous contrast.
CONTRAST:  100mL OMNIPAQUE IOHEXOL 300 MG/ML  SOLN

[Series 2: axial st · axial · 0.74mm/px · z∈[-697,-267]mm · 12 of 102 slices shown, 14 images]
[im 8/102  soft-tissue]
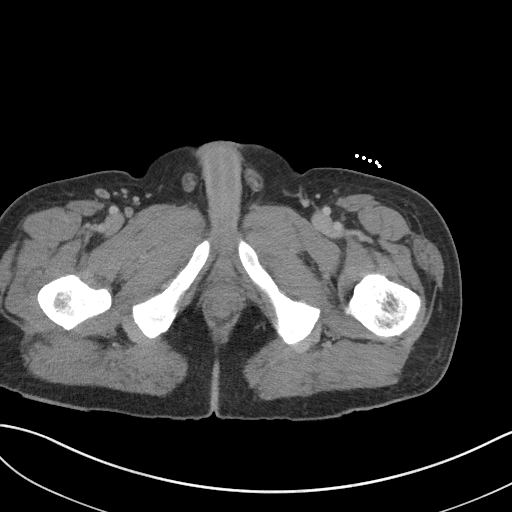
[im 8/102  bone]
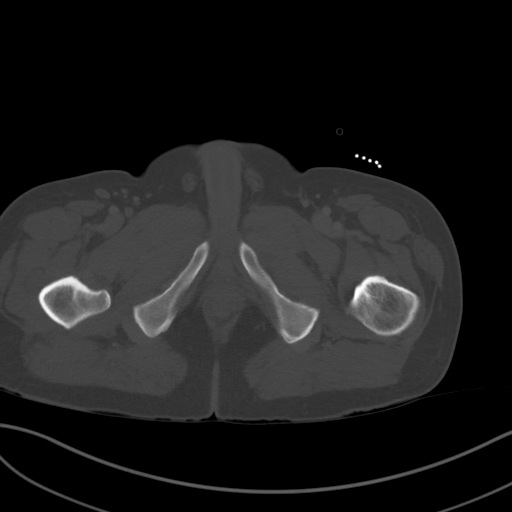
[im 15/102  soft-tissue]
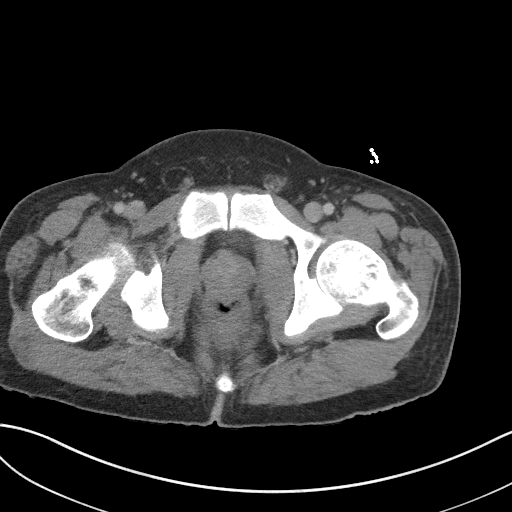
[im 22/102  soft-tissue]
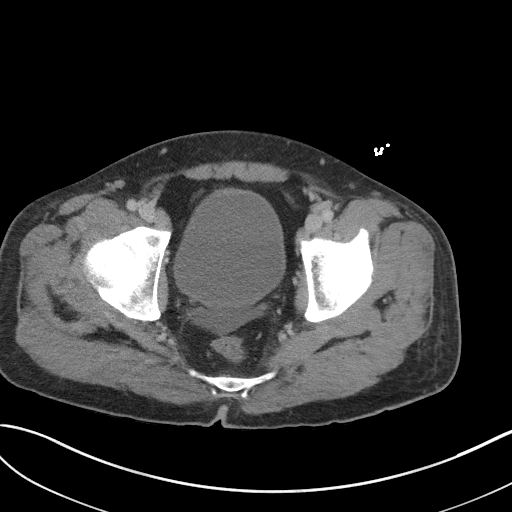
[im 29/102  soft-tissue]
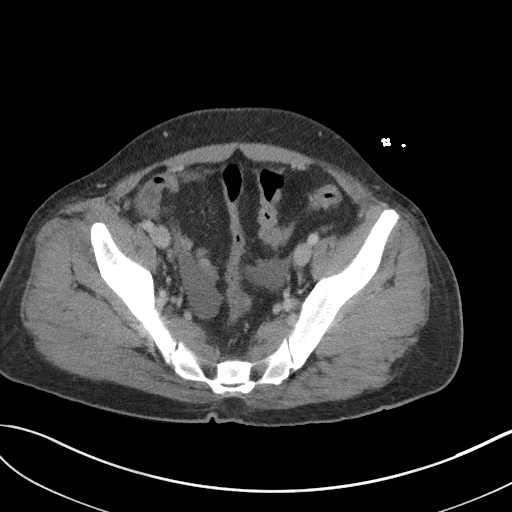
[im 37/102  soft-tissue]
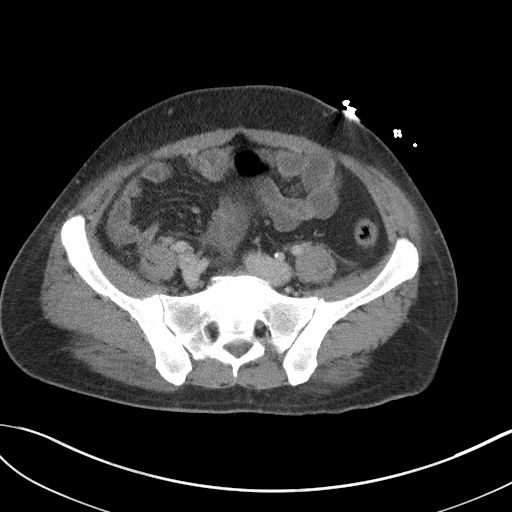
[im 44/102  soft-tissue]
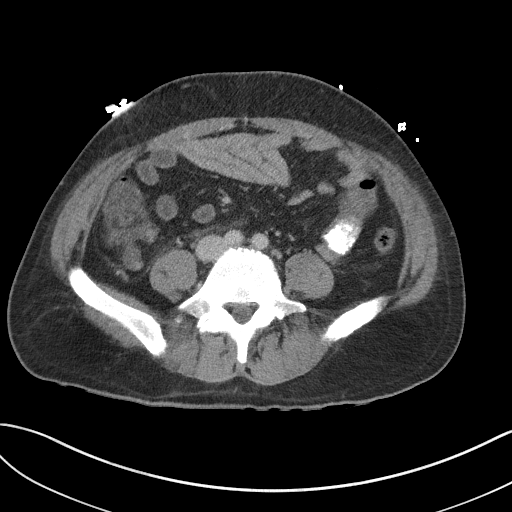
[im 58/102  soft-tissue]
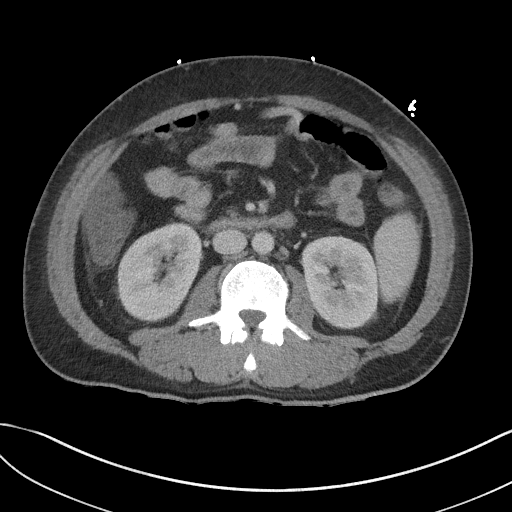
[im 65/102  soft-tissue]
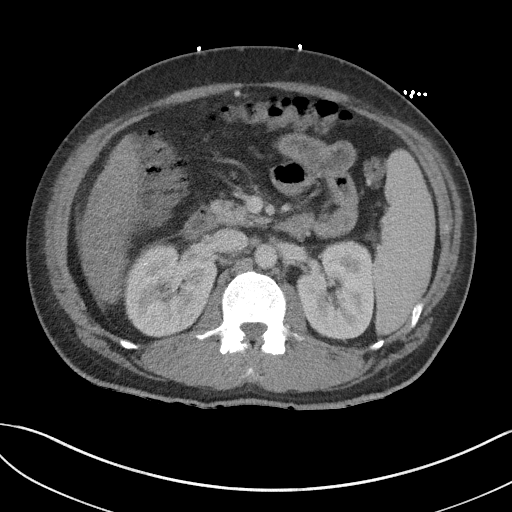
[im 73/102  soft-tissue]
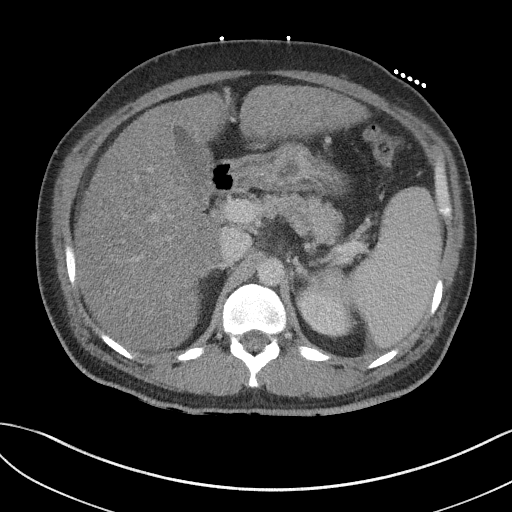
[im 73/102  bone]
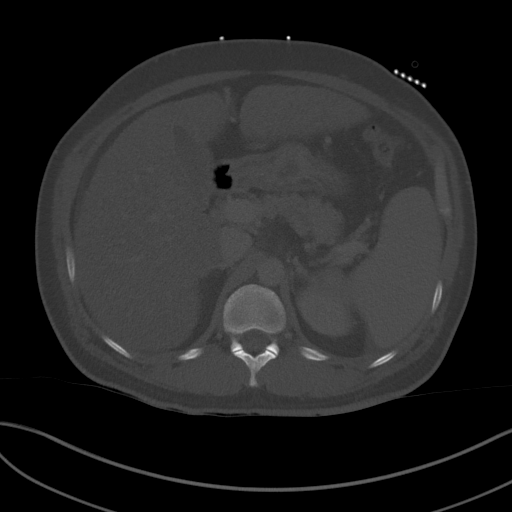
[im 80/102  soft-tissue]
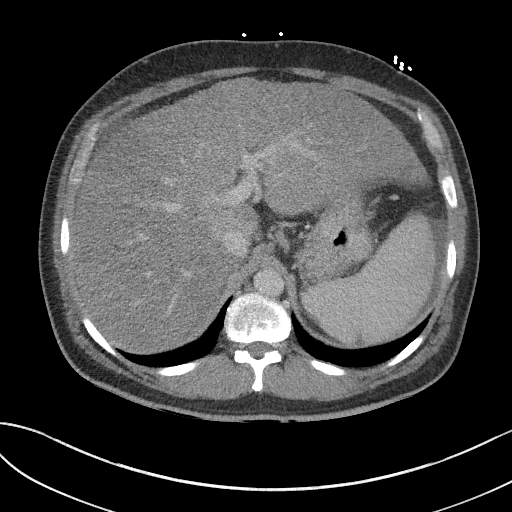
[im 87/102  soft-tissue]
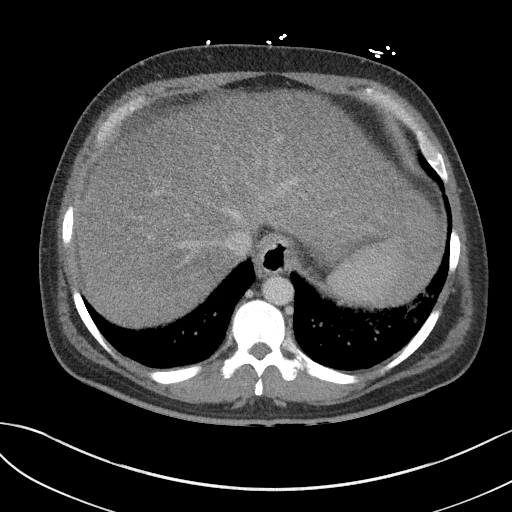
[im 94/102  soft-tissue]
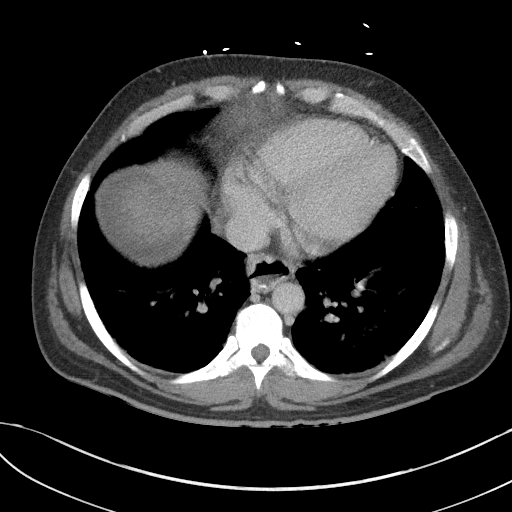

[Series 6: coronal st · coronal · 0.74mm/px · 3 of 101 slices shown]
[im 34/101  soft-tissue]
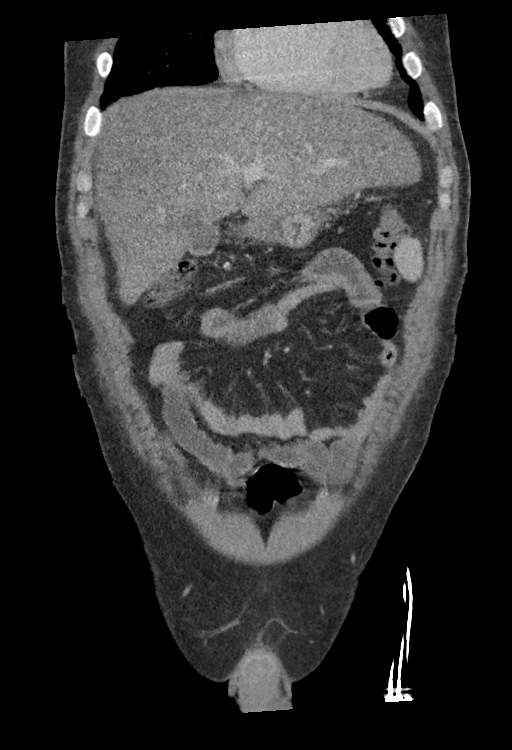
[im 45/101  soft-tissue]
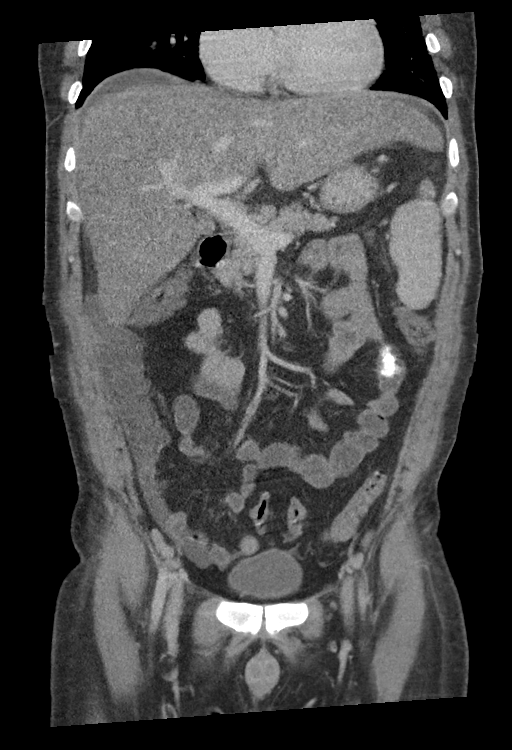
[im 56/101  soft-tissue]
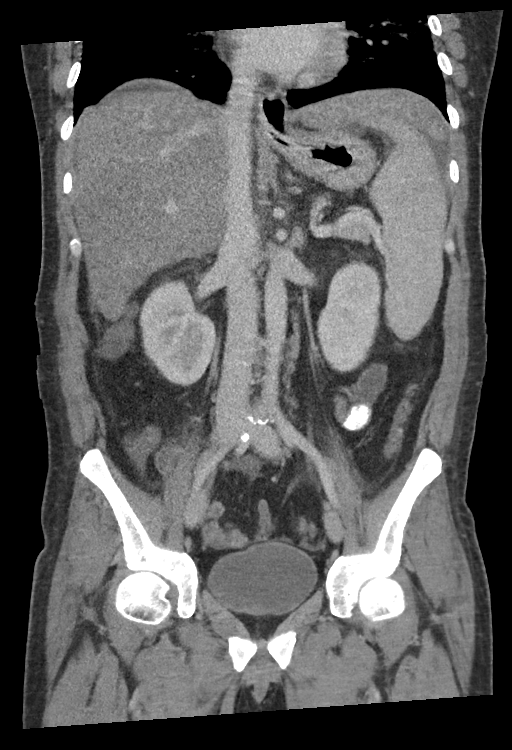

[15 of 46 positions shown; findings below may reference images not displayed]

FINDINGS: Lower chest: Large number of small, focal and confluent patchy
opacities at both lung bases, involving both lower lobes, right
middle lobe and lingula. No pleural fluid.

Hepatobiliary: Diffuse low density of the liver relative to the
spleen. Enlarged lateral segment left lobe and mildly enlarged
caudate lobe and mildly irregular liver contours.

Pancreas: Unremarkable. No pancreatic ductal dilatation or
surrounding inflammatory changes.

Spleen: Enlarged, measuring 16.2 cm in length.

Adrenals/Urinary Tract: Adrenal glands are unremarkable. Kidneys are
normal, without renal calculi, focal lesion, or hydronephrosis.
Bladder is unremarkable.

Stomach/Bowel: Small to moderate-sized hiatal hernia. Moderate
diffuse, low density proximal gastric wall thickening. Mild diffuse
small bowel wall thickening and enhancement. There is also some
diffuse low density wall thickening involving the right colon,
distal sigmoid colon and proximal rectum. Normal appearing appendix.

Vascular/Lymphatic: No significant vascular findings are present. No
enlarged abdominal or pelvic lymph nodes. There are mildly prominent
lymph nodes at the root of the mesentery without pathological
enlargement.

Reproductive: Prostate is unremarkable.

Other: Small amount of free peritoneal fluid in the abdomen and
small to moderate amount of free peritoneal fluid in the pelvis.
Small right inguinal hernia containing fat.

Musculoskeletal: Mild lumbar and lower thoracic spine degenerative
changes.
IMPRESSION: 1. Extensive focal and confluent patchy opacities at both lung
bases, compatible with an infectious process. Differential
considerations include multilobar pneumonia, septic emboli and
marked changes due to viral pneumonitis.
2. Bowel wall abnormalities involving the stomach, small bowel,
colon and proximal rectum, compatible with extensive changes of
gastroenteritis.
3. Small to moderate amount of free peritoneal fluid in the abdomen
and pelvis.
4. Diffuse hepatic steatosis and changes of cirrhosis with
associated splenomegaly.
5. Small to moderate-sized hiatal hernia.

## 2020-09-12 IMAGING — DX PORTABLE CHEST - 1 VIEW
1 series · 1 of 1 positions shown · non-contrast
Comparison: July 15, 2019

CLINICAL DATA: Evaluate ETT placement

EXAM:
PORTABLE CHEST 1 VIEW

[chest ap]
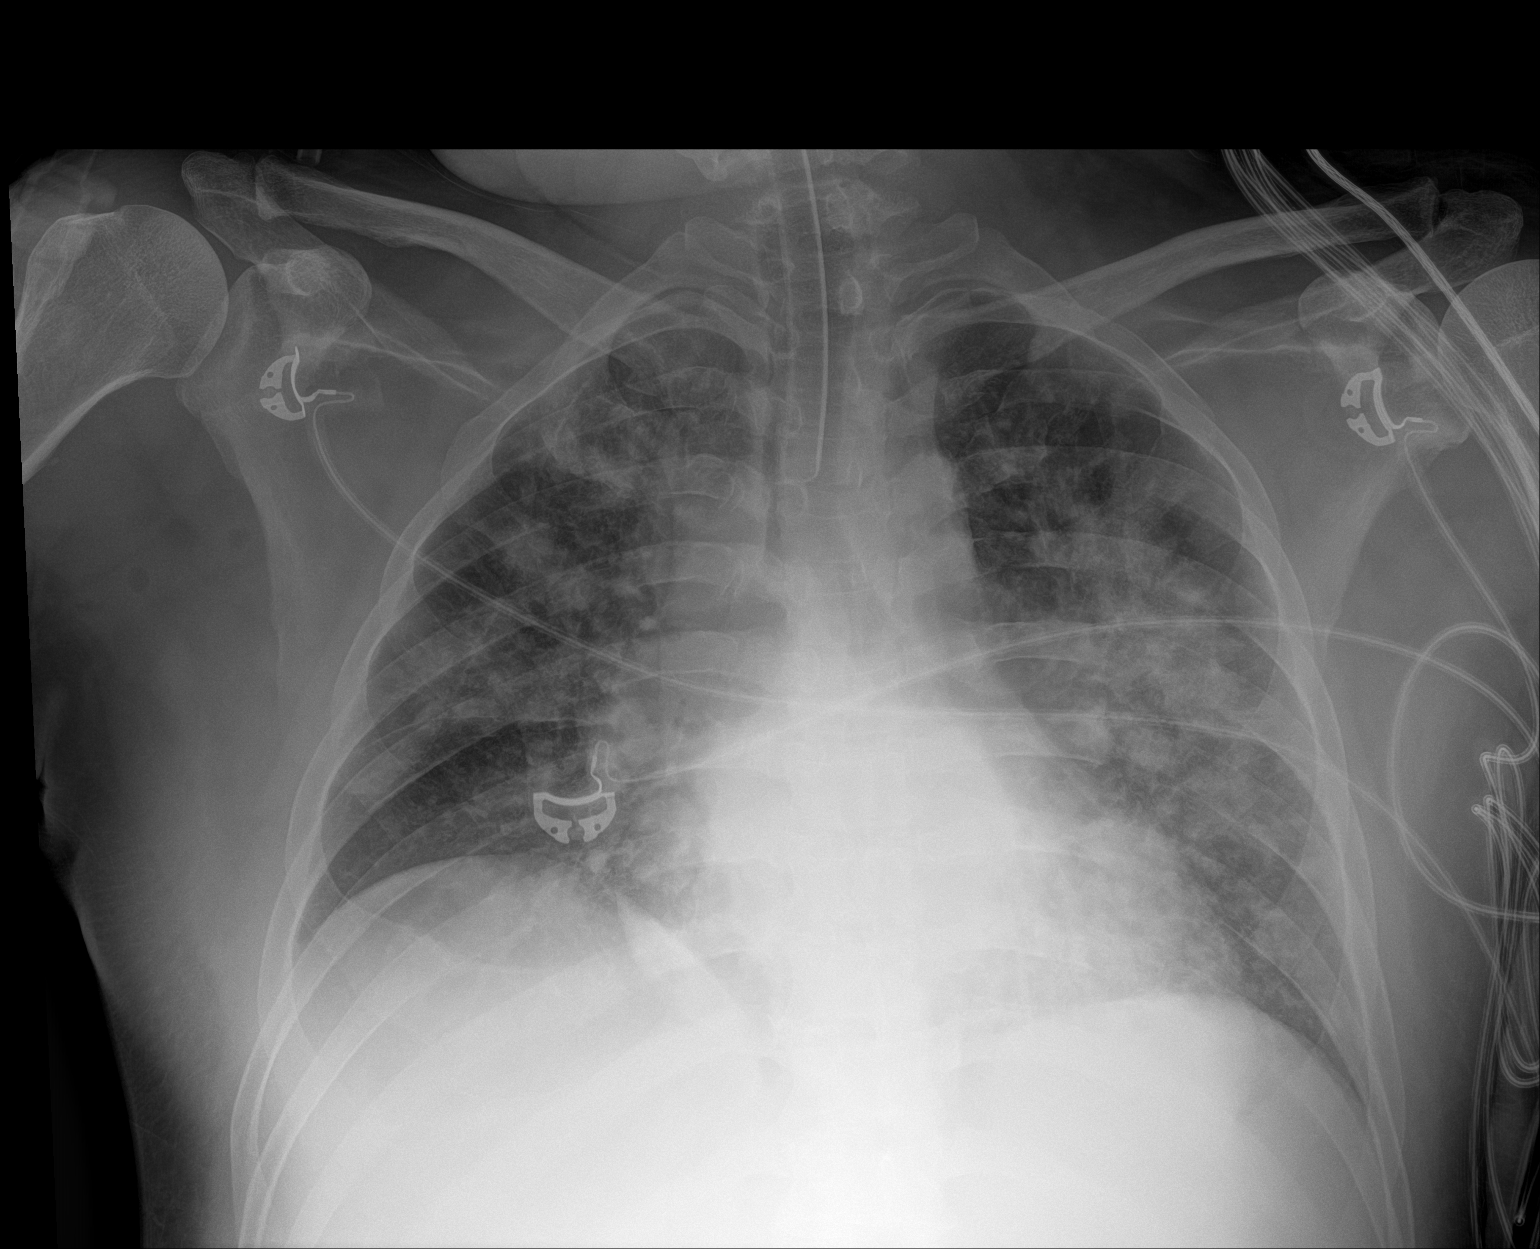

[1 of 1 positions shown; findings below may reference images not displayed]

FINDINGS: The ETT terminates in good position. No pneumothorax. Increasing
bilateral diffuse pulmonary infiltrates, left greater than right. No
other changes.
IMPRESSION: 1. The ETT is in good position.
2. Worsening bilateral pulmonary infiltrates, left greater than
right.
